# Patient Record
Sex: Female | Born: 1975 | Race: Black or African American | Hispanic: No | Marital: Married | Smoking: Never smoker
Health system: Southern US, Community
[De-identification: ages and names within clinical notes are randomized; demographics above are authoritative.]

## PROBLEM LIST (undated history)

## (undated) DIAGNOSIS — J302 Other seasonal allergic rhinitis: Secondary | ICD-10-CM

## (undated) DIAGNOSIS — R011 Cardiac murmur, unspecified: Secondary | ICD-10-CM

## (undated) DIAGNOSIS — L309 Dermatitis, unspecified: Secondary | ICD-10-CM

## (undated) DIAGNOSIS — I10 Essential (primary) hypertension: Secondary | ICD-10-CM

## (undated) HISTORY — PX: OTHER SURGICAL HISTORY: SHX169

## (undated) HISTORY — PX: ADENOIDECTOMY: SUR15

## (undated) HISTORY — DX: Other seasonal allergic rhinitis: J30.2

## (undated) HISTORY — DX: Dermatitis, unspecified: L30.9

## (undated) HISTORY — DX: Cardiac murmur, unspecified: R01.1

---
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
## 2012-05-26 LAB — CBC AND DIFFERENTIAL
HCT: 39 (ref 36–46)
HEMOGLOBIN: 13.4 (ref 12.0–16.0)
PLATELETS: 187 (ref 150–399)
WBC: 4.5

## 2012-05-26 LAB — HEPATIC FUNCTION PANEL
ALK PHOS: 50 (ref 25–125)
ALT: 15 (ref 7–35)
AST: 17 (ref 13–35)
Bilirubin, Total: 0.3

## 2012-05-26 LAB — BASIC METABOLIC PANEL
BUN: 10 (ref 4–21)
CREATININE: 0.8 (ref 0.5–1.1)
GLUCOSE: 79
POTASSIUM: 4.1 (ref 3.4–5.3)
Sodium: 140 (ref 137–147)

## 2012-05-26 LAB — LIPID PANEL
CHOLESTEROL: 187 (ref 0–200)
HDL: 54 (ref 35–70)
LDL Cholesterol: 115
Triglycerides: 89 (ref 40–160)

## 2012-05-26 LAB — TSH: TSH: 0.54 (ref 0.41–5.90)

## 2015-05-30 LAB — LIPID PANEL
Cholesterol: 196 (ref 0–200)
HDL: 66 (ref 35–70)
LDL Cholesterol: 120
Triglycerides: 51 (ref 40–160)

## 2015-05-30 LAB — HEPATIC FUNCTION PANEL
ALT: 15 (ref 7–35)
AST: 18 (ref 13–35)
Alkaline Phosphatase: 84 (ref 25–125)
BILIRUBIN, TOTAL: 0.5

## 2015-05-30 LAB — BASIC METABOLIC PANEL
BUN: 9 (ref 4–21)
CREATININE: 0.7 (ref 0.5–1.1)
GLUCOSE: 71
POTASSIUM: 3.8 (ref 3.4–5.3)
SODIUM: 142 (ref 137–147)

## 2015-05-30 LAB — CBC AND DIFFERENTIAL
HCT: 41 (ref 36–46)
HEMOGLOBIN: 13.5 (ref 12.0–16.0)
Platelets: 168 (ref 150–399)
WBC: 5.1

## 2015-05-30 LAB — TSH: TSH: 0.67 (ref 0.41–5.90)

## 2015-09-11 ENCOUNTER — Emergency Department (HOSPITAL_COMMUNITY)
Admission: EM | Admit: 2015-09-11 | Discharge: 2015-09-11 | Disposition: A | Discharge status: Home / Self Care | Payer: BC Managed Care – PPO | Attending: Emergency Medicine | Admitting: Emergency Medicine

## 2015-09-11 ENCOUNTER — Encounter (HOSPITAL_COMMUNITY): Payer: Self-pay | Admitting: Emergency Medicine

## 2015-09-11 DIAGNOSIS — R319 Hematuria, unspecified: Secondary | ICD-10-CM | POA: Insufficient documentation

## 2015-09-11 DIAGNOSIS — Z79899 Other long term (current) drug therapy: Secondary | ICD-10-CM | POA: Insufficient documentation

## 2015-09-11 DIAGNOSIS — Z3202 Encounter for pregnancy test, result negative: Secondary | ICD-10-CM | POA: Insufficient documentation

## 2015-09-11 DIAGNOSIS — R112 Nausea with vomiting, unspecified: Secondary | ICD-10-CM | POA: Insufficient documentation

## 2015-09-11 DIAGNOSIS — R1031 Right lower quadrant pain: Secondary | ICD-10-CM | POA: Diagnosis present

## 2015-09-11 DIAGNOSIS — R8299 Other abnormal findings in urine: Secondary | ICD-10-CM | POA: Diagnosis not present

## 2015-09-11 DIAGNOSIS — Z87442 Personal history of urinary calculi: Secondary | ICD-10-CM | POA: Diagnosis not present

## 2015-09-11 DIAGNOSIS — R109 Unspecified abdominal pain: Secondary | ICD-10-CM

## 2015-09-11 DIAGNOSIS — R82998 Other abnormal findings in urine: Secondary | ICD-10-CM

## 2015-09-11 LAB — URINALYSIS, ROUTINE W REFLEX MICROSCOPIC
Bilirubin Urine: NEGATIVE
GLUCOSE, UA: NEGATIVE mg/dL
Ketones, ur: NEGATIVE mg/dL
Nitrite: NEGATIVE
PROTEIN: 30 mg/dL — AB
Specific Gravity, Urine: 1.025 (ref 1.005–1.030)
pH: 5.5 (ref 5.0–8.0)

## 2015-09-11 LAB — COMPREHENSIVE METABOLIC PANEL
ALBUMIN: 4 g/dL (ref 3.5–5.0)
ALT: 14 U/L (ref 14–54)
AST: 13 U/L — AB (ref 15–41)
Alkaline Phosphatase: 87 U/L (ref 38–126)
Anion gap: 11 (ref 5–15)
BUN: 14 mg/dL (ref 6–20)
CHLORIDE: 107 mmol/L (ref 101–111)
CO2: 23 mmol/L (ref 22–32)
CREATININE: 0.86 mg/dL (ref 0.44–1.00)
Calcium: 9.6 mg/dL (ref 8.9–10.3)
GFR calc non Af Amer: >60 mL/min (ref >60)
Glucose, Bld: 102 mg/dL — ABNORMAL HIGH (ref 65–99)
Potassium: 3.5 mmol/L (ref 3.5–5.1)
SODIUM: 141 mmol/L (ref 135–145)
Total Bilirubin: 0.2 mg/dL — ABNORMAL LOW (ref 0.3–1.2)
Total Protein: 7.1 g/dL (ref 6.5–8.1)

## 2015-09-11 LAB — CBC
HCT: 39.1 % (ref 36.0–46.0)
Hemoglobin: 13.3 g/dL (ref 12.0–15.0)
MCH: 30.2 pg (ref 26.0–34.0)
MCHC: 34 g/dL (ref 30.0–36.0)
MCV: 88.7 fL (ref 78.0–100.0)
PLATELETS: 205 10*3/uL (ref 150–400)
RBC: 4.41 MIL/uL (ref 3.87–5.11)
RDW: 13.5 % (ref 11.5–15.5)
WBC: 6.3 10*3/uL (ref 4.0–10.5)

## 2015-09-11 LAB — POC URINE PREG, ED: Preg Test, Ur: NEGATIVE

## 2015-09-11 LAB — URINE MICROSCOPIC-ADD ON: BACTERIA UA: NONE SEEN

## 2015-09-11 LAB — LIPASE, BLOOD: LIPASE: 30 U/L (ref 11–51)

## 2015-09-11 MED ORDER — ONDANSETRON 4 MG PO TBDP
4.0000 mg | ORAL_TABLET | Freq: Once | ORAL | Status: AC
  Administered 2015-09-11: 4 mg via ORAL
  Filled 2015-09-11: qty 1

## 2015-09-11 MED ORDER — ONDANSETRON HCL 4 MG PO TABS: 4.0000 mg | ORAL_TABLET | Freq: Three times a day (TID) | ORAL | Status: AC | PRN

## 2015-09-11 MED ORDER — HYDROCODONE-ACETAMINOPHEN 5-325 MG PO TABS
1.0000 | ORAL_TABLET | Freq: Once | ORAL | Status: DC
  Filled 2015-09-11: qty 1

## 2015-09-11 MED ORDER — HYDROCODONE-ACETAMINOPHEN 5-325 MG PO TABS: 1.0000 | ORAL_TABLET | Freq: Four times a day (QID) | ORAL | Status: DC | PRN

## 2015-09-11 NOTE — ED Notes (Signed)
Pt. reports RLQ pain radiating to right lower bacj with emesis onset Saturday night , denies diarrhea / no fever or chills.

## 2015-09-11 NOTE — Discharge Instructions (Signed)
Abdominal Pain, Adult Many things can cause belly (abdominal) pain. Most times, the belly pain is not dangerous. Many cases of belly pain can be watched and treated at home. HOME CARE   Do not take medicines that help you go poop (laxatives) unless told to by your doctor.  Only take medicine as told by your doctor.  Eat or drink as told by your doctor. Your doctor will tell you if you should be on a special diet. GET HELP IF:  You do not know what is causing your belly pain.  You have belly pain while you are sick to your stomach (nauseous) or have runny poop (diarrhea).  You have pain while you pee or poop.  Your belly pain wakes you up at night.  You have belly pain that gets worse or better when you eat.  You have belly pain that gets worse when you eat fatty foods.  You have a fever. GET HELP RIGHT AWAY IF:   The pain does not go away within 2 hours.  You keep throwing up (vomiting).  The pain changes and is only in the right or left part of the belly.  You have bloody or tarry looking poop. MAKE SURE YOU:   Understand these instructions.  Will watch your condition.  Will get help right away if you are not doing well or get worse.   This information is not intended to replace advice given to you by your health care provider. Make sure you discuss any questions you have with your health care provider.   Document Released: 12/25/2007 Document Revised: 07/29/2014 Document Reviewed: 03/17/2013 Elsevier Interactive Patient Education 2016 [employer]. Renal Colic Renal colic is pain that is caused by passing a kidney stone. The pain can be sharp and severe. It may be felt in the back, abdomen, side (flank), or groin. It can cause nausea. Renal colic can come and go. HOME CARE INSTRUCTIONS Watch your condition for any changes. The following actions may help to lessen any discomfort that you are feeling:  Take medicines only as directed by your health care  provider.  Ask your health care provider if it is okay to take over-the-counter pain medicine.  Drink enough fluid to keep your urine clear or pale yellow. Drink 6-8 glasses of water each day.  Limit the amount of salt that you eat to less than 2 grams per day.  Reduce the amount of protein in your diet. Eat less meat, fish, nuts, and dairy.  Avoid foods such as spinach, rhubarb, nuts, or bran. These may make kidney stones more likely to form. SEEK MEDICAL CARE IF:  You have a fever or chills.  Your urine smells bad or looks cloudy.  You have pain or burning when you pass urine. SEEK IMMEDIATE MEDICAL CARE IF:  Your flank pain or groin pain suddenly worsens.  You become confused or disoriented or you lose consciousness.   This information is not intended to replace advice given to you by your health care provider. Make sure you discuss any questions you have with your health care provider.   Document Released: 04/17/2005 Document Revised: 07/29/2014 Document Reviewed: 05/18/2014 Elsevier Interactive Patient Education [employer].

## 2015-09-11 NOTE — ED Provider Notes (Signed)
CSN: [id number]     Arrival date & time 09/11/15  0529 History   First MD Initiated Contact with Patient 09/11/15 [phone]     Chief Complaint  Patient presents with  . Abdominal Pain     (Consider location/radiation/quality/duration/timing/severity/associated sxs/prior Treatment) Patient is a 40 y.o. female presenting with abdominal pain.  Abdominal Pain Pain location:  RLQ Pain quality: cramping   Pain radiates to:  R flank Pain severity:  Mild (at this time, but intermittently severe) Onset quality:  Gradual Duration:  2 days Timing:  Intermittent Progression:  Waxing and waning Chronicity:  New (Patient has a history of prior kidney stones. However her last episode was many years ago states that her last episode was more severe than this.) Context: not diet changes, not eating, not recent illness and not sick contacts   Relieved by: Self resolving. Worsened by:  Nothing tried Associated symptoms: nausea and vomiting (When the pain was extremely severe.)   Associated symptoms: no chest pain, no chills, no cough, no diarrhea, no dysuria, no fatigue, no fever, no shortness of breath, no sore throat, no vaginal bleeding and no vaginal discharge     History reviewed. No pertinent past medical history. History reviewed. No pertinent past surgical history. No family history on file. Social History  Substance Use Topics  . Smoking status: Never Smoker   . Smokeless tobacco: None  . Alcohol Use: No   OB History    No data available     Review of Systems  Constitutional: Negative for fever, chills, appetite change and fatigue.  HENT: Negative for congestion, ear pain, facial swelling, mouth sores and sore throat.   Eyes: Negative for visual disturbance.  Respiratory: Negative for cough, chest tightness and shortness of breath.   Cardiovascular: Negative for chest pain and palpitations.  Gastrointestinal: Positive for nausea, vomiting (When the pain was extremely severe.) and  abdominal pain. Negative for diarrhea and blood in stool.  Endocrine: Negative for cold intolerance and heat intolerance.  Genitourinary: Negative for dysuria, frequency, decreased urine volume, vaginal bleeding, vaginal discharge and difficulty urinating.  Musculoskeletal: Negative for back pain and neck stiffness.  Skin: Negative for rash.  Neurological: Negative for dizziness, weakness, light-headedness and headaches.  All other systems reviewed and are negative.     Allergies  Review of patient's allergies indicates no known allergies.  Home Medications   Prior to Admission medications   Medication Sig Start Date End Date Taking? Authorizing Provider  Prenatal Vit-Fe Fumarate-FA (MULTIVITAMIN-PRENATAL) 27-0.8 MG TABS tablet Take 1 tablet by mouth daily at 12 noon.   Yes Historical Provider, MD  HYDROcodone-acetaminophen (NORCO) 5-325 MG tablet Take 1 tablet by mouth every 6 (six) hours as needed for moderate pain. 09/11/15   Addison Lank, MD  ondansetron (ZOFRAN) 4 MG tablet Take 1 tablet (4 mg total) by mouth every 8 (eight) hours as needed for nausea or vomiting. 09/11/15 09/15/15  Addison Lank, MD   BP 147/87 mmHg  Pulse 56  Temp(Src) 98.2 F (36.8 C) (Oral)  Resp 24  Ht 5\' 3"  (1.6 m)  Wt 83.008 kg  BMI 32.43 kg/m2  SpO2 100%  LMP 09/05/2015 Physical Exam  Constitutional: She is oriented to person, place, and time. She appears well-developed and well-nourished. No distress.  HENT:  Head: Normocephalic and atraumatic.  Right Ear: External ear normal.  Left Ear: External ear normal.  Nose: Nose normal.  Eyes: Conjunctivae and EOM are normal. Pupils are equal, round, and reactive to light. Right  eye exhibits no discharge. Left eye exhibits no discharge. No scleral icterus.  Neck: Normal range of motion. Neck supple.  Cardiovascular: Normal rate, regular rhythm and normal heart sounds.  Exam reveals no gallop and no friction rub.   No murmur heard. Pulmonary/Chest: Effort  normal and breath sounds normal. No stridor. No respiratory distress. She has no wheezes.  Abdominal: Soft. She exhibits no distension. There is tenderness (mild discomfort) in the right lower quadrant. There is no rigidity, no guarding and no CVA tenderness. No hernia.  Musculoskeletal: She exhibits no edema or tenderness.  Neurological: She is alert and oriented to person, place, and time.  Skin: Skin is warm and dry. No rash noted. She is not diaphoretic. No erythema.  Psychiatric: She has a normal mood and affect.    ED Course  Procedures (including critical care time) Labs Review Labs Reviewed  COMPREHENSIVE METABOLIC PANEL - Abnormal; Notable for the following:    Glucose, Bld 102 (*)    AST 13 (*)    Total Bilirubin 0.2 (*)    All other components within normal limits  URINALYSIS, ROUTINE W REFLEX MICROSCOPIC (NOT AT [facility]) - Abnormal; Notable for the following:    APPearance CLOUDY (*)    Hgb urine dipstick LARGE (*)    Protein, ur 30 (*)    Leukocytes, UA TRACE (*)    All other components within normal limits  URINE MICROSCOPIC-ADD ON - Abnormal; Notable for the following:    Squamous Epithelial / LPF 0-5 (*)    Crystals CA OXALATE CRYSTALS (*)    All other components within normal limits  LIPASE, BLOOD  CBC  POC URINE PREG, ED    Imaging Review No results found. I have personally reviewed and evaluated these images and lab results as part of my medical decision-making.   EKG Interpretation None      MDM    39 year old female with a history of prior kidney stones presents to the ED with right lower quadrant abdominal pain radiating to her right flank that is currently almost completely resolved. Rest of the history as above.  On arrival patient is afebrile with stable vital signs. No acute distress, well-appearing, well-hydrated. Abdomen with mild right lower quadrant discomfort. Negative McBurney's. No evidence of peritonitis.   Labs without leukocytosis,  biliary obstruction, renal insufficiency. UPT negative. UA with large amount of blood as well as calcium oxalate crystals. Given her presentation in conjunction with prior history of stones we believe that this is consistent with kidney stones. Presentation concerning for obstruction. Low concern for SBO, appendicitis, ovarian torsion, PID.  Patient provided with symptomatic treatment. Patient is safe for discharge with strict return precautions. Patient to follow-up with PCP as needed.  She was seen in conjunction with Dr. Rex Kras.  Diagnostic studies interpreted by me and use to my clinical decision-making.   Final diagnoses:  Abdominal cramping  Calcium oxalate crystals in urine  Hematuria        Addison Lank, MD 09/11/15 [place], MD 09/20/15 [phone]

## 2015-09-18 ENCOUNTER — Ambulatory Visit (INDEPENDENT_AMBULATORY_CARE_PROVIDER_SITE_OTHER): Payer: BC Managed Care – PPO | Admitting: Primary Care

## 2015-09-18 ENCOUNTER — Encounter: Payer: Self-pay | Admitting: Primary Care

## 2015-09-18 VITALS — BP 116/80 | HR 67 | Temp 98.1°F | Ht 63.0 in | Wt 184.8 lb

## 2015-09-18 DIAGNOSIS — E669 Obesity, unspecified: Secondary | ICD-10-CM | POA: Diagnosis not present

## 2015-09-18 DIAGNOSIS — D229 Melanocytic nevi, unspecified: Secondary | ICD-10-CM

## 2015-09-18 DIAGNOSIS — N200 Calculus of kidney: Secondary | ICD-10-CM | POA: Diagnosis not present

## 2015-09-18 NOTE — Progress Notes (Signed)
Subjective:    Patient ID: Katelyn Wright, female    DOB: 1976-04-18, 40 y.o.   MRN: [id number]  HPI  Katelyn Wright is a 40 year old female who presents today to establish care and discuss the problems mentioned below. She has her old records with her, including labs. Will reivew records. Her last physical was in November 2016. She and her husband just moved to the area from [place].  1) Emergency Department Follow Up: Evaluated on 09/11/15 at [facility] with complaints of RLQ abdominal pain with radiation to right flank. Her pain had been present for 2 days prior to her visit and was intermittent and crampy in nature. She has a medical history of kidney stones with her last one being in 2008.  During her stay in the ED she underwent labs. UA with evidence of blood and oxylate crystals. CMP and CBC unremarkable.   Since her last visit she's passed a total of 3 stones with symptoms of cramping and soreness. She's not noticed any hematuria since Saturday. She was prescribed Zofran and Vicodin but did not get these filled as she is breast feeding and does not want to reduce her milk supply. Overall feeling improved. No pain since the last passing of her stone.  2) Finger Nail Discoloration: About 2 weeks ago she noticed a dark spot to the left thumb nail. She's also noticed dark lines to her toes and numerous new moles to her chest wall and extremities. She is postpartum since Nov 28, 2014. She has not noticed any changes in size and shape of her new moles. No prior history of melanoma. She does have 2 large moles to her left jaw line that she was in the process of having removed prior to her move to [place]. She was unable to have these removed now that she's stable in [place].  Review of Systems  Constitutional: Negative for unexpected weight change.  HENT: Negative for rhinorrhea.   Respiratory: Negative for cough and shortness of breath.   Cardiovascular: Negative for chest pain and palpitations.  Gastrointestinal:  Negative for diarrhea and constipation.       Bowel movements every other day  Genitourinary: Negative for dysuria, hematuria, flank pain and difficulty urinating.  Musculoskeletal: Negative for myalgias and arthralgias.  Skin:       See HPI  Psychiatric/Behavioral:       Denies concerns for anxiety or depression.        Past Medical History  Diagnosis Date  . Seasonal allergies   . Heart murmur     Social History   Social History  . Marital Status: Married    Spouse Name: N/A  . Number of Children: N/A  . Years of Education: N/A   Occupational History  . Not on file.   Social History Main Topics  . Smoking status: Never Smoker   . Smokeless tobacco: Not on file  . Alcohol Use: No  . Drug Use: No  . Sexual Activity: Not on file   Other Topics Concern  . Not on file   Social History Narrative   Married.   1 daughter.    Works as a Agricultural engineer.   Enjoys spending time with her family, working out.     Past Surgical History  Procedure Laterality Date  . Tonsillectomy and adenoidectomy  1994    Family History  Problem Relation Age of Onset  . Arthritis Mother   . Hypertension Mother   . Hypertension Father   .  Alcohol abuse Maternal Uncle   . Heart disease Maternal Grandfather   . Breast cancer Paternal Grandmother     No Known Allergies  Current Outpatient Prescriptions on File Prior to Visit  Medication Sig Dispense Refill  . Prenatal Vit-Fe Fumarate-FA (MULTIVITAMIN-PRENATAL) 27-0.8 MG TABS tablet Take 1 tablet by mouth daily at 12 noon.     No current facility-administered medications on file prior to visit.    BP 116/80 mmHg  Pulse 67  Temp(Src) 98.1 F (36.7 C) (Oral)  Ht 5\' 3"  (1.6 m)  Wt 184 lb 12.8 oz (83.825 kg)  BMI 32.74 kg/m2  SpO2 99%  LMP 09/05/2015    Objective:   Physical Exam  Constitutional: She is oriented to person, place, and time. She appears well-nourished.  Cardiovascular: Normal rate and regular rhythm.     Pulmonary/Chest: Effort normal and breath sounds normal.  Neurological: She is alert and oriented to person, place, and time.  Skin: Skin is warm and dry.  Numerous small, dark moles to anterior chest wall. Small discoloration to right breast at 9 o'clock position. 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   Psychiatric: She has a normal mood and affect.          Assessment & Plan:  Skin Changes:  Numerous new nevi to body. Also with new spot to left 1st digit nail bed. 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 Due to multiple new nevi and request for mole removal to left mandibular region, referral will be placed to dermatology for further evaluation.  No obvious melanoma concern noted, but will have derm evaluate.

## 2015-09-18 NOTE — Assessment & Plan Note (Signed)
Recurrent. Also in 2001, 2008, and present. She has passed 3 stones total since ED visit and is feeling well.  Exam unremarkable. Discussed prevention techniques for kidney stones.

## 2015-09-18 NOTE — Progress Notes (Signed)
Pre visit review using our clinic review tool, if applicable. No additional management support is needed unless otherwise documented below in the visit note. 

## 2015-09-18 NOTE — Assessment & Plan Note (Signed)
Spent a long time discussing ways to lose weight through healthy diet and routine exercise. Material provided to patient, recommended My Fitness Pal app for calorie counting.

## 2015-09-22 ENCOUNTER — Telehealth: Payer: Self-pay

## 2015-09-22 ENCOUNTER — Encounter (HOSPITAL_COMMUNITY): Payer: Self-pay | Admitting: *Deleted

## 2015-09-22 ENCOUNTER — Emergency Department (HOSPITAL_COMMUNITY)
Admission: EM | Admit: 2015-09-22 | Discharge: 2015-09-22 | Disposition: A | Discharge status: Home / Self Care | Payer: BC Managed Care – PPO | Source: Home / Self Care | Attending: Family Medicine | Admitting: Family Medicine

## 2015-09-22 ENCOUNTER — Emergency Department (HOSPITAL_COMMUNITY)
Admission: EM | Admit: 2015-09-22 | Discharge: 2015-09-22 | Discharge status: Absent without leave | Payer: BC Managed Care – PPO

## 2015-09-22 DIAGNOSIS — R1011 Right upper quadrant pain: Secondary | ICD-10-CM

## 2015-09-22 DIAGNOSIS — R109 Unspecified abdominal pain: Secondary | ICD-10-CM

## 2015-09-22 LAB — POCT URINALYSIS DIP (DEVICE)
BILIRUBIN URINE: NEGATIVE
GLUCOSE, UA: NEGATIVE mg/dL
Ketones, ur: 40 mg/dL — AB
LEUKOCYTES UA: NEGATIVE
NITRITE: NEGATIVE
PH: 6 (ref 5.0–8.0)
Protein, ur: NEGATIVE mg/dL
Specific Gravity, Urine: >=1.03 (ref 1.005–1.030)
Urobilinogen, UA: 0.2 mg/dL (ref 0.0–1.0)

## 2015-09-22 MED ORDER — HYDROCODONE-ACETAMINOPHEN 5-325 MG PO TABS: 1.0000 | ORAL_TABLET | ORAL | Status: DC | PRN

## 2015-09-22 MED ORDER — ONDANSETRON HCL 4 MG PO TABS: 4.0000 mg | ORAL_TABLET | Freq: Four times a day (QID) | ORAL | Status: DC

## 2015-09-22 MED ORDER — TAMSULOSIN HCL 0.4 MG PO CAPS: 0.4000 mg | ORAL_CAPSULE | Freq: Every day | ORAL | Status: DC

## 2015-09-22 NOTE — ED Notes (Signed)
Pt   Was  Seen   approx  13 days   And   Was  Diagnosed   With  Kidney     Stone        Was     Discharged  From     hosp       And       Did  Not  Get         Her  rx    Filled           The  Pain     Coming  And  Going  Since  Then     -  Pt  Was   Seen     By   A    pcp  [place]       3  Days  Ago   Feels  Dizzy      Was     Dropped  Off   By  Her    Husband    Pain  Scale  Is  5   Now      With  Some  nausea

## 2015-09-22 NOTE — ED Provider Notes (Signed)
CSN: [id number]     Arrival date & time 09/22/15  1754 History   None    Chief Complaint  Patient presents with  . Flank Pain   (Consider location/radiation/quality/duration/timing/severity/associated sxs/prior Treatment) Patient is a 40 y.o. female presenting with flank pain. The history is provided by the patient.  Flank Pain This is a recurrent problem. The current episode started more than 2 days ago. The problem has been gradually worsening. Associated symptoms include abdominal pain. Pertinent negatives include no chest pain, no headaches and no shortness of breath.    Past Medical History  Diagnosis Date  . Seasonal allergies   . Heart murmur    Past Surgical History  Procedure Laterality Date  . Tonsillectomy and adenoidectomy  1994   Family History  Problem Relation Age of Onset  . Arthritis Mother   . Hypertension Mother   . Hypertension Father   . Alcohol abuse Maternal Uncle   . Heart disease Maternal Grandfather   . Breast cancer Paternal Grandmother    Social History  Substance Use Topics  . Smoking status: Never Smoker   . Smokeless tobacco: None  . Alcohol Use: No   OB History    No data available     Review of Systems  Constitutional: Negative.   Respiratory: Negative for shortness of breath.   Cardiovascular: Negative for chest pain.  Gastrointestinal: Positive for nausea, vomiting and abdominal pain. Negative for diarrhea and constipation.  Genitourinary: Positive for flank pain. Negative for dysuria, urgency, hematuria, difficulty urinating, menstrual problem and pelvic pain.  Musculoskeletal: Negative.   Skin: Negative.   Neurological: Negative for headaches.  All other systems reviewed and are negative.   Allergies  Review of patient's allergies indicates no known allergies.  Home Medications   Prior to Admission medications   Medication Sig Start Date End Date Taking? Authorizing Provider  HYDROcodone-acetaminophen (NORCO/VICODIN)  5-325 MG tablet Take 1 tablet by mouth every 4 (four) hours as needed. 09/22/15   Billy Fischer, MD  ondansetron (ZOFRAN) 4 MG tablet Take 1 tablet (4 mg total) by mouth every 6 (six) hours. Prn n/v. 09/22/15   Billy Fischer, MD  Prenatal Vit-Fe Fumarate-FA (MULTIVITAMIN-PRENATAL) 27-0.8 MG TABS tablet Take 1 tablet by mouth daily at 12 noon.    Historical Provider, MD  tamsulosin (FLOMAX) 0.4 MG CAPS capsule Take 1 capsule (0.4 mg total) by mouth daily. 09/22/15   Billy Fischer, MD   Meds Ordered and Administered this Visit  Medications - No data to display  BP 120/78 mmHg  Pulse 64  Temp(Src) 98.7 F (37.1 C) (Oral)  Resp 12  SpO2 100%  LMP 09/05/2015 No data found.   Physical Exam  Constitutional: She is oriented to person, place, and time. She appears well-developed and well-nourished. She appears distressed.  HENT:  Head: Normocephalic.  Mouth/Throat: Oropharynx is clear and moist.  Pulmonary/Chest: Breath sounds normal.  Abdominal: Soft. Bowel sounds are normal. She exhibits no distension and no mass. There is tenderness. There is no rebound and no guarding.  Musculoskeletal: She exhibits no tenderness.  Lymphadenopathy:    She has no cervical adenopathy.  Neurological: She is alert and oriented to person, place, and time. No cranial nerve deficit.  Skin: Skin is warm and dry.  Nursing note and vitals reviewed.   ED Course  Procedures (including critical care time)  Labs Review Labs Reviewed  POCT URINALYSIS DIP (DEVICE) - Abnormal; Notable for the following:    Ketones, ur 40 (*)  Hgb urine dipstick TRACE (*)    All other components within normal limits    Imaging Review No results found.   Visual Acuity Review  Right Eye Distance:   Left Eye Distance:   Bilateral Distance:    Right Eye Near:   Left Eye Near:    Bilateral Near:         MDM   1. Acute right flank pain        Billy Fischer, MD 09/22/15 2037

## 2015-09-22 NOTE — Discharge Instructions (Signed)
See urologist next week for recheck. Sooner if problem worsens.

## 2015-09-22 NOTE — Telephone Encounter (Signed)
Pt left v/m pt did not get rx for vicodan and zofran. Kidney stones is bothering her again and [facility] [place] needs new rx for these meds. Sp[facility] with [facility] M[facility] at [facility] and if rx is from ED will not fill rx after one week.Pain in a lot of pain on rt side and back. Pain level is 10. Pt went to [facility] ED and was too busy and pt left. Spoke with Dr Diona Browner and advised pt if did not want to go to ED to go to [facility]; Dr Diona Browner cannot call in pain med and office is closed so cannot pick up rx. Pt voiced understanding and will go to [facility]>

## 2015-09-27 ENCOUNTER — Encounter (HOSPITAL_COMMUNITY): Payer: Self-pay | Admitting: *Deleted

## 2015-09-27 ENCOUNTER — Other Ambulatory Visit: Payer: Self-pay | Admitting: Urology

## 2015-09-29 ENCOUNTER — Ambulatory Visit (HOSPITAL_COMMUNITY): Payer: BC Managed Care – PPO | Admitting: Anesthesiology

## 2015-09-29 ENCOUNTER — Encounter (HOSPITAL_COMMUNITY)
Admission: RE | Disposition: A | Discharge status: Home / Self Care | Payer: Self-pay | Source: Ambulatory Visit | Attending: Urology

## 2015-09-29 ENCOUNTER — Ambulatory Visit (HOSPITAL_COMMUNITY): Payer: BC Managed Care – PPO

## 2015-09-29 ENCOUNTER — Encounter (HOSPITAL_COMMUNITY): Payer: Self-pay | Admitting: *Deleted

## 2015-09-29 ENCOUNTER — Ambulatory Visit (HOSPITAL_COMMUNITY)
Admission: RE | Admit: 2015-09-29 | Discharge: 2015-09-29 | Disposition: A | Discharge status: Home / Self Care | Payer: BC Managed Care – PPO | Source: Ambulatory Visit | Attending: Urology | Admitting: Urology

## 2015-09-29 DIAGNOSIS — Z87442 Personal history of urinary calculi: Secondary | ICD-10-CM | POA: Diagnosis not present

## 2015-09-29 DIAGNOSIS — N201 Calculus of ureter: Secondary | ICD-10-CM | POA: Diagnosis present

## 2015-09-29 DIAGNOSIS — Z6833 Body mass index (BMI) 33.0-33.9, adult: Secondary | ICD-10-CM | POA: Insufficient documentation

## 2015-09-29 DIAGNOSIS — E669 Obesity, unspecified: Secondary | ICD-10-CM | POA: Insufficient documentation

## 2015-09-29 DIAGNOSIS — Z79899 Other long term (current) drug therapy: Secondary | ICD-10-CM | POA: Diagnosis not present

## 2015-09-29 HISTORY — PX: CYSTOSCOPY WITH URETEROSCOPY AND STENT PLACEMENT: SHX6377

## 2015-09-29 HISTORY — PX: HOLMIUM LASER APPLICATION: SHX5852

## 2015-09-29 LAB — PREGNANCY, URINE: Preg Test, Ur: NEGATIVE

## 2015-09-29 SURGERY — CYSTOURETEROSCOPY, WITH STENT INSERTION
Anesthesia: General | Laterality: Right

## 2015-09-29 MED ORDER — CEFAZOLIN SODIUM-DEXTROSE 2-3 GM-% IV SOLR: 2.0000 g | INTRAVENOUS | Status: DC

## 2015-09-29 MED ORDER — PHENAZOPYRIDINE HCL 200 MG PO TABS
ORAL_TABLET | ORAL | Status: AC
  Filled 2015-09-29: qty 1

## 2015-09-29 MED ORDER — IBUPROFEN 600 MG PO TABS
600.0000 mg | ORAL_TABLET | Freq: Once | ORAL | Status: AC
  Administered 2015-09-29: 600 mg via ORAL
  Filled 2015-09-29: qty 1

## 2015-09-29 MED ORDER — PROMETHAZINE HCL 25 MG/ML IJ SOLN: 6.2500 mg | INTRAMUSCULAR | Status: DC | PRN

## 2015-09-29 MED ORDER — OXYBUTYNIN CHLORIDE 5 MG PO TABS
5.0000 mg | ORAL_TABLET | Freq: Once | ORAL | Status: AC
  Administered 2015-09-29: 5 mg via ORAL

## 2015-09-29 MED ORDER — MIDAZOLAM HCL 2 MG/2ML IJ SOLN
INTRAMUSCULAR | Status: AC
Start: 2015-09-29 — End: 2015-09-29
  Filled 2015-09-29: qty 2

## 2015-09-29 MED ORDER — SODIUM CHLORIDE 0.9 % IR SOLN
Status: DC | PRN
  Administered 2015-09-29: 4000 mL via INTRAVESICAL

## 2015-09-29 MED ORDER — ONDANSETRON HCL 4 MG/2ML IJ SOLN
INTRAMUSCULAR | Status: DC | PRN
  Administered 2015-09-29: 4 mg via INTRAVENOUS

## 2015-09-29 MED ORDER — FENTANYL CITRATE (PF) 100 MCG/2ML IJ SOLN
INTRAMUSCULAR | Status: DC | PRN
  Administered 2015-09-29 (×2): 50 ug via INTRAVENOUS

## 2015-09-29 MED ORDER — OXYBUTYNIN CHLORIDE 5 MG PO TABS
ORAL_TABLET | ORAL | Status: AC
  Filled 2015-09-29: qty 1

## 2015-09-29 MED ORDER — ONDANSETRON HCL 4 MG/2ML IJ SOLN
INTRAMUSCULAR | Status: AC
  Filled 2015-09-29: qty 2

## 2015-09-29 MED ORDER — PHENAZOPYRIDINE HCL 200 MG PO TABS
200.0000 mg | ORAL_TABLET | Freq: Once | ORAL | Status: AC
  Administered 2015-09-29: 200 mg via ORAL

## 2015-09-29 MED ORDER — LIDOCAINE HCL (CARDIAC) 20 MG/ML IV SOLN
INTRAVENOUS | Status: AC
  Filled 2015-09-29: qty 5

## 2015-09-29 MED ORDER — FENTANYL CITRATE (PF) 100 MCG/2ML IJ SOLN
INTRAMUSCULAR | Status: AC
  Filled 2015-09-29: qty 2

## 2015-09-29 MED ORDER — HYDROMORPHONE HCL 1 MG/ML IJ SOLN
INTRAMUSCULAR | Status: AC
  Filled 2015-09-29: qty 1

## 2015-09-29 MED ORDER — DEXAMETHASONE SODIUM PHOSPHATE 4 MG/ML IJ SOLN
INTRAMUSCULAR | Status: DC | PRN
  Administered 2015-09-29: 10 mg via INTRAVENOUS

## 2015-09-29 MED ORDER — PROPOFOL 10 MG/ML IV BOLUS
INTRAVENOUS | Status: AC
  Filled 2015-09-29: qty 20

## 2015-09-29 MED ORDER — LIDOCAINE HCL (CARDIAC) 20 MG/ML IV SOLN
INTRAVENOUS | Status: DC | PRN
  Administered 2015-09-29: 80 mg via INTRAVENOUS

## 2015-09-29 MED ORDER — HYDROMORPHONE HCL 1 MG/ML IJ SOLN
0.2500 mg | INTRAMUSCULAR | Status: DC | PRN
  Administered 2015-09-29 (×2): 0.25 mg via INTRAVENOUS

## 2015-09-29 MED ORDER — IOHEXOL 300 MG/ML  SOLN
INTRAMUSCULAR | Status: DC | PRN
  Administered 2015-09-29: 10 mL via URETHRAL

## 2015-09-29 MED ORDER — PROPOFOL 10 MG/ML IV BOLUS
INTRAVENOUS | Status: DC | PRN
  Administered 2015-09-29 (×3): 10 mg via INTRAVENOUS
  Administered 2015-09-29: 170 mg via INTRAVENOUS

## 2015-09-29 MED ORDER — MEPERIDINE HCL 50 MG/ML IJ SOLN: 6.2500 mg | INTRAMUSCULAR | Status: DC | PRN

## 2015-09-29 MED ORDER — LACTATED RINGERS IV SOLN
INTRAVENOUS | Status: DC
  Administered 2015-09-29: 1000 mL via INTRAVENOUS

## 2015-09-29 MED ORDER — DEXAMETHASONE SODIUM PHOSPHATE 10 MG/ML IJ SOLN
INTRAMUSCULAR | Status: AC
Start: 2015-09-29 — End: 2015-09-29
  Filled 2015-09-29: qty 1

## 2015-09-29 MED ORDER — MIDAZOLAM HCL 5 MG/5ML IJ SOLN
INTRAMUSCULAR | Status: DC | PRN
  Administered 2015-09-29: 2 mg via INTRAVENOUS

## 2015-09-29 SURGICAL SUPPLY — 21 items
BAG URO CATCHER STRL LF (MISCELLANEOUS) ×2 IMPLANT
BASKET DAKOTA 1.9FR 11X120 (BASKET) ×2 IMPLANT
CATH INTERMIT  6FR 70CM (CATHETERS) ×2 IMPLANT
CATH URET 5FR 28IN CONE TIP (BALLOONS)
CATH URET 5FR 70CM CONE TIP (BALLOONS) IMPLANT
CLOTH BEACON ORANGE TIMEOUT ST (SAFETY) ×2 IMPLANT
FIBER LASER FLEXIVA 1000 (UROLOGICAL SUPPLIES) IMPLANT
FIBER LASER FLEXIVA 200 (UROLOGICAL SUPPLIES) ×2 IMPLANT
FIBER LASER FLEXIVA 365 (UROLOGICAL SUPPLIES) IMPLANT
FIBER LASER FLEXIVA 550 (UROLOGICAL SUPPLIES) IMPLANT
FIBER LASER TRAC TIP (UROLOGICAL SUPPLIES) IMPLANT
GLOVE BIOGEL M 8.0 STRL (GLOVE) ×6 IMPLANT
GOWN STRL REUS W/ TWL XL LVL3 (GOWN DISPOSABLE) ×2 IMPLANT
GOWN STRL REUS W/TWL XL LVL3 (GOWN DISPOSABLE) ×2 IMPLANT
GUIDEWIRE STR DUAL SENSOR (WIRE) ×2 IMPLANT
MANIFOLD NEPTUNE II (INSTRUMENTS) ×2 IMPLANT
PACK CYSTO (CUSTOM PROCEDURE TRAY) ×2 IMPLANT
SHEATH ACCESS URETERAL 24CM (SHEATH) ×2 IMPLANT
STENT POLARIS 5FRX24 (STENTS) ×2 IMPLANT
SYRINGE IRR TOOMEY STRL 70CC (SYRINGE) ×2 IMPLANT
TUBING CONNECTING 10 (TUBING) ×2 IMPLANT

## 2015-09-29 NOTE — Anesthesia Postprocedure Evaluation (Signed)
Anesthesia Post Note  Patient: Katelyn Wright  Procedure(s) Performed: Procedure(s) (LRB): RIGHT  URETEROSCOPY AND STENT PLACEMENT, RETROGRADE (Right) WITH HOLMIUM LASER (Right)  Patient location during evaluation: PACU Anesthesia Type: General Level of consciousness: sedated and patient cooperative Pain management: pain level controlled Vital Signs Assessment: post-procedure vital signs reviewed and stable Respiratory status: spontaneous breathing Cardiovascular status: stable Anesthetic complications: no    Last Vitals:  Filed Vitals:   09/29/15 1330 09/29/15 1356  BP: 124/87 125/76  Pulse: 58 68  Temp: 36.4 C 36.8 C  Resp: 14 14    Last Pain:  Filed Vitals:   09/29/15 1357  PainSc: [place]

## 2015-09-29 NOTE — Transfer of Care (Signed)
Immediate Anesthesia Transfer of Care Note  Patient: Katelyn Wright  Procedure(s) Performed: Procedure(s): RIGHT  URETEROSCOPY AND STENT PLACEMENT, RETROGRADE (Right) WITH HOLMIUM LASER (Right)  Patient Location: PACU  Anesthesia Type:General  Level of Consciousness: awake, alert , oriented and patient cooperative  Airway & Oxygen Therapy: Patient Spontanous Breathing and Patient connected to face mask oxygen  Post-op Assessment: Report given to RN and Post -op Vital signs reviewed and stable  Post vital signs: Reviewed and stable  Last Vitals:  Filed Vitals:   09/29/15 0852  BP: 113/81  Pulse: 64  Temp: 36.8 C  Resp: 16    Complications: No apparent anesthesia complications

## 2015-09-29 NOTE — Progress Notes (Signed)
Patient ambulated to BR w asst. Very unsteady on feet. States feels "loopy". Able urinate large amount urine

## 2015-09-29 NOTE — Anesthesia Preprocedure Evaluation (Addendum)
Anesthesia Evaluation  Patient identified by MRN, date of birth, ID band Patient awake    Reviewed: Allergy & Precautions, NPO status , Patient's Chart, lab work & pertinent test results  Airway Mallampati: II  TM Distance: >3 FB Neck ROM: Full    Dental no notable dental hx.    Pulmonary neg pulmonary ROS,    Pulmonary exam normal breath sounds clear to auscultation       Cardiovascular negative cardio ROS Normal cardiovascular exam+ Valvular Problems/Murmurs  Rhythm:Regular Rate:Normal     Neuro/Psych negative neurological ROS  negative psych ROS   GI/Hepatic negative GI ROS, Neg liver ROS,   Endo/Other  negative endocrine ROS  Renal/GU Renal disease     Musculoskeletal negative musculoskeletal ROS (+)   Abdominal (+) + obese,   Peds  Hematology negative hematology ROS (+)   Anesthesia Other Findings   Reproductive/Obstetrics negative OB ROS                             Anesthesia Physical Anesthesia Plan  ASA: II  Anesthesia Plan: General   Post-op Pain Management:    Induction: Intravenous  Airway Management Planned: LMA  Additional Equipment:   Intra-op Plan:   Post-operative Plan: Extubation in OR  Informed Consent: I have reviewed the patients History and Physical, chart, labs and discussed the procedure including the risks, benefits and alternatives for the proposed anesthesia with the patient or authorized representative who has indicated his/her understanding and acceptance.   Dental advisory given  Plan Discussed with: CRNA  Anesthesia Plan Comments:         Anesthesia Quick Evaluation

## 2015-09-29 NOTE — Discharge Instructions (Signed)

## 2015-09-29 NOTE — H&P (Signed)
History of Present Illness Katelyn Wright is a 40 year old female with a Rt. ureteral stone.    She was seen on 09/22/15 and at that time she indicated that she had been experiencing right flank pain that had become progressively worse. It radiated into the abdomen. Her urine was trace positive for blood by dipstick. She has a history of kidney stones. She said she passed one stone about 10-15 years ago. She has never required surgical instrumentation for her stones. She has had intermittent pain primarily in the right lower quadrant. When it occurs it is not relieved by positional change. It has been severe at times. She is not having pain currently. She was prescribed pain medication and Flomax but cannot take the Flomax because she is currently breast-feeding.   Past Medical History Problems  1. History of cardiac murmur (Z86.79)  Surgical History Problems  1. History of Tonsillectomy  Current Meds 1. BL Ibuprofen TABS;  Therapy: (Recorded:07Mar2017) to Recorded 2. Norco 5-325 MG Oral Tablet;  Therapy: (Recorded:07Mar2017) to Recorded 3. Prenatal TABS;  Therapy: (Recorded:07Mar2017) to Recorded 4. Tylenol CAPS;  Therapy: (Recorded:07Mar2017) to Recorded  Allergies Medication  1. No Known Drug Allergies  Family History Problems  1. Family history of ovarian cancer (Z80.41) : Mother  Social History Problems    Denied: History of Alcohol use   Caffeine use (F15.90)   Married   Never a smoker  Review of Systems Genitourinary, constitutional, skin, eye, otolaryngeal, hematologic/lymphatic, cardiovascular, pulmonary, endocrine, musculoskeletal, gastrointestinal, neurological and psychiatric system(s) were reviewed and pertinent findings if present are noted and are otherwise negative.  Genitourinary: urinary frequency and dysuria.  Gastrointestinal: nausea, vomiting, flank pain and diarrhea.  Musculoskeletal: back pain.  Neurological: dizziness.    Vitals Vital Signs   Height: 5 ft 3 in Weight: 183 lb  BMI Calculated: 32.42 BSA Calculated: 1.86 Blood Pressure: 114 / 70 Heart Rate: 61  Physical Exam Constitutional: Well nourished and well developed . No acute distress.  ENT:. The ears and nose are normal in appearance.  Neck: The appearance of the neck is normal and no neck mass is present.  Pulmonary: No respiratory distress and normal respiratory rhythm and effort.  Cardiovascular: Heart rate and rhythm are normal . No peripheral edema.  Abdomen: The abdomen is soft and nontender. No masses are palpated. No CVA tenderness. No hernias are palpable. No hepatosplenomegaly noted.  Lymphatics: The femoral and inguinal nodes are not enlarged or tender.  Skin: Normal skin turgor, no visible rash and no visible skin lesions.  Neuro/Psych:. Mood and affect are appropriate.    Results/Data Urine  COLOR STRAW  APPEARANCE CLEAR  SPECIFIC GRAVITY <1.005  pH 5.5  GLUCOSE NEGATIVE  BILIRUBIN NEGATIVE  KETONE NEGATIVE  BLOOD NEGATIVE  PROTEIN NEGATIVE  NITRITE NEGATIVE  LEUKOCYTE ESTERASE NEGATIVE   Old records or history reviewed: Notes from Dr. Shaune Pascal as above.  The following images/tracing/specimen were independently visualized:  CT scan: No left renal calculi are noted. A single 1-2 millimeters stone is located in the mid to upper pole of the right kidney. She has a 3 mm stone just above the bladder on the right side.  The following clinical lab reports were reviewed:  UA: Clear.    Assessment   I went over the results of her CT scan with her and clinically I feel that the calcification in her pelvis that is 3 mm in size is very possibly a stone although the radiologist did not seem to think that was the  case. Clinically though it seems that that would be the case.     We discussed the management of urinary stones. These options include observation, ureteroscopy, shockwave lithotripsy, and PCNL. We discussed which options are relevant to these  particular stones. We discussed the natural history of stones as well as the complications of untreated stones and the impact on quality of life without treatment as well as with each of the above listed treatments. We also discussed the efficacy of each treatment in its ability to clear the stone burden. With any of these management options I discussed the signs and symptoms of infection and the need for emergent treatment should these be experienced. For each option we discussed the ability of each procedure to clear the patient of their stone burden.    For observation I described the risks which include but are not limited to silent renal damage, life-threatening infection, need for emergent surgery, failure to pass stone, and pain.    For ureteroscopy I described the risks which include heart attack, stroke, pulmonary embolus, death, bleeding, infection, damage to contiguous structures, positioning injury, ureteral stricture, ureteral avulsion, ureteral injury, need for ureteral stent, inability to perform ureteroscopy, need for an interval procedure, inability to clear stone burden, stent discomfort and pain.    For shockwave lithotripsy I described the risks which include arrhythmia, kidney contusion, kidney hemorrhage, need for transfusion, long-term risk of diabetes or hypertension, back discomfort, flank ecchymosis, flank abrasion, inability to break up stone, inability to pass stone fragments, Steinstrasse, infection associated with obstructing stones, need for different surgical procedure and possible need for repeat shockwave lithotripsy.    After discussing all the options she has elected to proceed with ureteroscopy.   Plan  1. Prescription for Dilaudid.  2. She will be scheduled for ureteroscopic extraction of her right distal ureteral stone.

## 2015-09-29 NOTE — Anesthesia Procedure Notes (Signed)
Procedure Name: LMA Insertion Date/Time: 09/29/2015 11:55 AM Performed by: Raenette Rover Pre-anesthesia Checklist: Patient identified, Emergency Drugs available, Suction available and Patient being monitored Patient Re-evaluated:Patient Re-evaluated prior to inductionOxygen Delivery Method: Circle system utilized Preoxygenation: Pre-oxygenation with 100% oxygen Intubation Type: IV induction Ventilation: Mask ventilation without difficulty LMA: LMA inserted LMA Size: 4.0 Number of attempts: 1 Placement Confirmation: positive ETCO2,  CO2 detector and breath sounds checked- equal and bilateral Tube secured with: Tape Dental Injury: Teeth and Oropharynx as per pre-operative assessment

## 2015-09-29 NOTE — Progress Notes (Signed)
Patient a little more steady on feet. Asst to BR to void. Vomited small amout clear emesis while up

## 2015-09-29 NOTE — Op Note (Signed)
PATIENT:  Katelyn Wright  PRE-OPERATIVE DIAGNOSIS:  right Ureteral calculus  POST-OPERATIVE DIAGNOSIS: Same  PROCEDURE:  1. Cystoscopy with right retrograde pyelogram including interpretation. 2. Right ureteroscopy and laser lithotripsy 3. Right ureteral stone extraction 4. Right double-J stent placement  SURGEON: Claybon Jabs, MD  INDICATION: Mrs. Nicolls is a 40 year old female who has been experiencing intermittent right lower quadrant pain. A CT scan revealed a calcification along the expected course of the ureter and she has continued to have intermittent discomfort. We therefore discussed retrograde to confirm the stone and ureteroscopic management.  ANESTHESIA:  General  EBL:  Minimal  DRAINS: 5 French, 24 cm Polaris stent (with string)  SPECIMEN:  Stone taken to my office for composition analysis.  DESCRIPTION OF PROCEDURE: The patient was taken to the major OR and placed on the table. General anesthesia was administered and then the patient was moved to the dorsal lithotomy position. The genitalia was sterilely prepped and draped. An official timeout was performed.  Initially the 88 French cystoscope with 30 lens was passed under direct vision into the bladder. The bladder was then fully inspected. It was noted be free of any tumors, stones or inflammatory lesions. Ureteral orifices were of normal configuration and position. A 6 French open-ended ureteral catheter was then passed through the cystoscope into the ureteral orifice in order to perform a right retrograde pyelogram.  A retrograde pyelogram was performed by injecting full-strength contrast up the right ureter under direct fluoroscopic control. It revealed a filling defect in the distal ureter consistent with the stone seen on the preoperative KUB. The remainder of the ureter was noted to be normal as was the intrarenal collecting system. I then passed a 0.038 inch floppy-tipped guidewire through the open ended catheter and  into the area of the renal pelvis and this was left in place. The inner portion of a ureteral access sheath was then passed over the guidewire to gently dilate the intramural ureter. I then proceeded with ureteroscopy.  A 6 French microfine rigid ureteroscope was then passed under direct into the bladder and into the right orifice and up the ureter. The stone was identified and I felt it was too large to extract and therefore elected to proceed with laser lithotripsy. The 200  holmium laser fiber was used to fragment the stone. I then used the nitinol basket to extract all of the stone fragments and reinspection of the ureter ureteroscopically revealed no further stone fragments and no injury to the ureter. I then backloaded the cystoscope over the guidewire and passed the stent over the guidewire into the area of the renal pelvis. As the guidewire was removed good curl was noted in the renal pelvis. The bladder was drained and the cystoscope was then removed. The patient tolerated the procedure well no intraoperative complications.  PLAN OF CARE: Discharge to home after PACU  PATIENT DISPOSITION:  PACU - hemodynamically stable.

## 2015-09-30 ENCOUNTER — Emergency Department (HOSPITAL_COMMUNITY): Payer: BC Managed Care – PPO

## 2015-09-30 ENCOUNTER — Encounter (HOSPITAL_COMMUNITY): Payer: Self-pay | Admitting: Emergency Medicine

## 2015-09-30 ENCOUNTER — Emergency Department (HOSPITAL_COMMUNITY)
Admission: EM | Admit: 2015-09-30 | Discharge: 2015-09-30 | Disposition: A | Discharge status: Home / Self Care | Payer: BC Managed Care – PPO | Attending: Emergency Medicine | Admitting: Emergency Medicine

## 2015-09-30 DIAGNOSIS — R011 Cardiac murmur, unspecified: Secondary | ICD-10-CM | POA: Diagnosis not present

## 2015-09-30 DIAGNOSIS — K5900 Constipation, unspecified: Secondary | ICD-10-CM | POA: Insufficient documentation

## 2015-09-30 DIAGNOSIS — Y658 Other specified misadventures during surgical and medical care: Secondary | ICD-10-CM | POA: Diagnosis not present

## 2015-09-30 DIAGNOSIS — N390 Urinary tract infection, site not specified: Secondary | ICD-10-CM | POA: Diagnosis not present

## 2015-09-30 DIAGNOSIS — Z9889 Other specified postprocedural states: Secondary | ICD-10-CM | POA: Insufficient documentation

## 2015-09-30 DIAGNOSIS — R1031 Right lower quadrant pain: Secondary | ICD-10-CM | POA: Diagnosis present

## 2015-09-30 DIAGNOSIS — R319 Hematuria, unspecified: Secondary | ICD-10-CM

## 2015-09-30 DIAGNOSIS — Z79899 Other long term (current) drug therapy: Secondary | ICD-10-CM | POA: Diagnosis not present

## 2015-09-30 DIAGNOSIS — T8384XA Pain from genitourinary prosthetic devices, implants and grafts, initial encounter: Secondary | ICD-10-CM | POA: Diagnosis not present

## 2015-09-30 DIAGNOSIS — Z792 Long term (current) use of antibiotics: Secondary | ICD-10-CM | POA: Insufficient documentation

## 2015-09-30 DIAGNOSIS — Z3202 Encounter for pregnancy test, result negative: Secondary | ICD-10-CM | POA: Insufficient documentation

## 2015-09-30 LAB — POC URINE PREG, ED
Preg Test, Ur: NEGATIVE
Preg Test, Ur: NEGATIVE

## 2015-09-30 LAB — CBC WITH DIFFERENTIAL/PLATELET
Basophils Absolute: 0 10*3/uL (ref 0.0–0.1)
Basophils Relative: 0 %
EOS ABS: 0 10*3/uL (ref 0.0–0.7)
Eosinophils Relative: 0 %
HCT: 42.7 % (ref 36.0–46.0)
HEMOGLOBIN: 14.1 g/dL (ref 12.0–15.0)
LYMPHS ABS: 1.5 10*3/uL (ref 0.7–4.0)
LYMPHS PCT: 20 %
MCH: 29.3 pg (ref 26.0–34.0)
MCHC: 33 g/dL (ref 30.0–36.0)
MCV: 88.8 fL (ref 78.0–100.0)
MONOS PCT: 7 %
Monocytes Absolute: 0.5 10*3/uL (ref 0.1–1.0)
NEUTROS PCT: 73 %
Neutro Abs: 5.5 10*3/uL (ref 1.7–7.7)
Platelets: 244 10*3/uL (ref 150–400)
RBC: 4.81 MIL/uL (ref 3.87–5.11)
RDW: 13.4 % (ref 11.5–15.5)
WBC: 7.6 10*3/uL (ref 4.0–10.5)

## 2015-09-30 LAB — URINALYSIS, ROUTINE W REFLEX MICROSCOPIC
GLUCOSE, UA: NEGATIVE mg/dL
KETONES UR: 40 mg/dL — AB
NITRITE: POSITIVE — AB
PH: 6.5 (ref 5.0–8.0)
Protein, ur: 100 mg/dL — AB
Specific Gravity, Urine: 1.019 (ref 1.005–1.030)

## 2015-09-30 LAB — URINE MICROSCOPIC-ADD ON: SQUAMOUS EPITHELIAL / LPF: NONE SEEN

## 2015-09-30 LAB — I-STAT CHEM 8, ED
BUN: 14 mg/dL (ref 6–20)
CHLORIDE: 103 mmol/L (ref 101–111)
CREATININE: 0.7 mg/dL (ref 0.44–1.00)
Calcium, Ion: 1.17 mmol/L (ref 1.12–1.23)
Glucose, Bld: 108 mg/dL — ABNORMAL HIGH (ref 65–99)
HEMATOCRIT: 47 % — AB (ref 36.0–46.0)
Hemoglobin: 16 g/dL — ABNORMAL HIGH (ref 12.0–15.0)
POTASSIUM: 4.6 mmol/L (ref 3.5–5.1)
SODIUM: 139 mmol/L (ref 135–145)
TCO2: 24 mmol/L (ref 0–100)

## 2015-09-30 MED ORDER — DEXTROSE 5 % IV SOLN
1.0000 g | Freq: Once | INTRAVENOUS | Status: DC
  Filled 2015-09-30: qty 10

## 2015-09-30 MED ORDER — HYDROMORPHONE HCL 1 MG/ML IJ SOLN
1.0000 mg | Freq: Once | INTRAMUSCULAR | Status: AC
  Administered 2015-09-30: 1 mg via INTRAVENOUS
  Filled 2015-09-30: qty 1

## 2015-09-30 MED ORDER — KETOROLAC TROMETHAMINE 30 MG/ML IJ SOLN
30.0000 mg | Freq: Once | INTRAMUSCULAR | Status: AC
  Administered 2015-09-30: 30 mg via INTRAVENOUS
  Filled 2015-09-30: qty 1

## 2015-09-30 MED ORDER — CIPROFLOXACIN HCL 500 MG PO TABS: 500.0000 mg | ORAL_TABLET | Freq: Two times a day (BID) | ORAL | Status: AC

## 2015-09-30 MED ORDER — OXYBUTYNIN CHLORIDE 5 MG PO TABS: 5.0000 mg | ORAL_TABLET | Freq: Three times a day (TID) | ORAL | Status: DC | PRN

## 2015-09-30 MED ORDER — PHENAZOPYRIDINE HCL 100 MG PO TABS: 100.0000 mg | ORAL_TABLET | Freq: Three times a day (TID) | ORAL | Status: DC

## 2015-09-30 MED ORDER — ONDANSETRON 4 MG PO TBDP: 4.0000 mg | ORAL_TABLET | Freq: Three times a day (TID) | ORAL | Status: DC | PRN

## 2015-09-30 MED ORDER — ONDANSETRON HCL 4 MG/2ML IJ SOLN
4.0000 mg | Freq: Once | INTRAMUSCULAR | Status: AC
  Administered 2015-09-30: 4 mg via INTRAVENOUS
  Filled 2015-09-30: qty 2

## 2015-09-30 MED ORDER — OXYBUTYNIN CHLORIDE 5 MG PO TABS
5.0000 mg | ORAL_TABLET | Freq: Three times a day (TID) | ORAL | Status: DC
  Administered 2015-09-30: 5 mg via ORAL
  Filled 2015-09-30 (×3): qty 1

## 2015-09-30 MED ORDER — SODIUM CHLORIDE 0.9 % IV BOLUS (SEPSIS)
1000.0000 mL | Freq: Once | INTRAVENOUS | Status: AC
  Administered 2015-09-30: 1000 mL via INTRAVENOUS

## 2015-09-30 NOTE — ED Notes (Signed)
Patient states she is breastfeeding and needs to pump at this time. Patient does not have breast pump with her, advised patient I will call for one to be brought to the ER.

## 2015-09-30 NOTE — ED Notes (Signed)
Bed: [id number] Expected date:  Expected time:  Means of arrival:  Comments: Nurse covering 22

## 2015-09-30 NOTE — ED Notes (Addendum)
Patient states she had a stent placed on 09/29/2015 to left. Patient states she is now having pain to the right flank that "feels like a kidney stone". Patient states she is also vomiting and unable to keep her medications down. Patient was brought to the ER by her husband who remains in the vehicle with a small child. Patient is seen at [facility]. Patient states pain and vomiting started around midnight. Patient states her urine and her vomit "look the same", it's bloody looking.

## 2015-09-30 NOTE — ED Notes (Signed)
Patient assisted with the use of breast pump. Patient is aware of needed urine specimen.

## 2015-09-30 NOTE — ED Provider Notes (Signed)
CSN: [id number]     Arrival date & time 09/30/15  0522 History   First MD Initiated Contact with Patient 09/30/15 0703     Chief Complaint  Patient presents with  . Flank Pain    right, s/p stent placement on 09/29/2015     (Consider location/radiation/quality/duration/timing/severity/associated sxs/prior Treatment) HPI Comments: Felt like kidney stone, right flank pain, since stent placed yesterday and stone removed Severe pain, right flank and feeling of bladder spasms Pain RLQ radiating around to right lower back Pain worse with urination Dilaudid rx, zofran odt only minimal relief Still vomiting Emesis dark yellow    Patient is a 40 y.o. female presenting with flank pain.  Flank Pain Associated symptoms include abdominal pain. Pertinent negatives include no chest pain, no headaches and no shortness of breath.    Past Medical History  Diagnosis Date  . Seasonal allergies   . Heart murmur    Past Surgical History  Procedure Laterality Date  . Tonsillectomy and adenoidectomy  1994  . Right stent placed      Right Renal Stent   Family History  Problem Relation Age of Onset  . Arthritis Mother   . Hypertension Mother   . Hypertension Father   . Alcohol abuse Maternal Uncle   . Heart disease Maternal Grandfather   . Breast cancer Paternal Grandmother    Social History  Substance Use Topics  . Smoking status: Never Smoker   . Smokeless tobacco: Never Used  . Alcohol Use: No   OB History    No data available     Review of Systems  Constitutional: Negative for fever.  HENT: Negative for sore throat.   Eyes: Negative for visual disturbance.  Respiratory: Negative for cough and shortness of breath.   Cardiovascular: Negative for chest pain.  Gastrointestinal: Positive for nausea, vomiting (x4), abdominal pain and constipation.  Genitourinary: Positive for dysuria, hematuria and flank pain. Negative for vaginal bleeding, vaginal discharge and difficulty  urinating.  Musculoskeletal: Negative for back pain and neck pain.  Skin: Negative for rash.  Neurological: Negative for syncope and headaches.      Allergies  Review of patient's allergies indicates no known allergies.  Home Medications   Prior to Admission medications   Medication Sig Start Date End Date Taking? Authorizing Provider  HYDROmorphone (DILAUDID) 2 MG tablet Take 2-4 mg by mouth every 6 (six) hours as needed for severe pain.   Yes Historical Provider, MD  ibuprofen (ADVIL,MOTRIN) 600 MG tablet Take 600 mg by mouth every 6 (six) hours as needed for moderate pain.    Yes Historical Provider, MD  ondansetron (ZOFRAN) 4 MG tablet Take 1 tablet (4 mg total) by mouth every 6 (six) hours. Prn n/v. Patient taking differently: Take 4 mg by mouth every 6 (six) hours as needed for nausea. Prn n/v. 09/22/15  Yes Billy Fischer, MD  Prenatal Vit-Fe Fumarate-FA (MULTIVITAMIN-PRENATAL) 27-0.8 MG TABS tablet Take 1 tablet by mouth daily at 12 noon.   Yes Historical Provider, MD  Probiotic Product (PROBIOTIC ADVANCED PO) Take 1 capsule by mouth daily.   Yes Historical Provider, MD  triamcinolone (KENALOG) 0.025 % cream Apply 1 application topically daily as needed (for eczema).   Yes Historical Provider, MD  ciprofloxacin (CIPRO) 500 MG tablet Take 1 tablet (500 mg total) by mouth every 12 (twelve) hours. 09/30/15 10/14/15  Gareth Morgan, MD  HYDROcodone-acetaminophen (NORCO/VICODIN) 5-325 MG tablet Take 1 tablet by mouth every 4 (four) hours as needed. Patient not taking:  Reported on 09/30/2015 09/22/15   Billy Fischer, MD  ondansetron (ZOFRAN ODT) 4 MG disintegrating tablet Take 1 tablet (4 mg total) by mouth every 8 (eight) hours as needed for nausea or vomiting. 09/30/15   Gareth Morgan, MD  oxybutynin (DITROPAN) 5 MG tablet Take 1 tablet (5 mg total) by mouth every 8 (eight) hours as needed for bladder spasms. 09/30/15   Gareth Morgan, MD  tamsulosin (FLOMAX) 0.4 MG CAPS capsule Take 1  capsule (0.4 mg total) by mouth daily. Patient not taking: Reported on 09/27/2015 09/22/15   Billy Fischer, MD   BP 115/70 mmHg  Pulse 56  Temp(Src) 98 F (36.7 C) (Oral)  Resp 17  Ht 5\' 3"  (1.6 m)  Wt 186 lb (84.369 kg)  BMI 32.96 kg/m2  SpO2 100%  LMP 09/05/2015 Physical Exam  Constitutional: She is oriented to person, place, and time. She appears well-developed and well-nourished. No distress.  HENT:  Head: Normocephalic and atraumatic.  Eyes: Conjunctivae and EOM are normal.  Neck: Normal range of motion.  Cardiovascular: Normal rate, regular rhythm, normal heart sounds and intact distal pulses.  Exam reveals no gallop and no friction rub.   No murmur heard. Pulmonary/Chest: Effort normal and breath sounds normal. No respiratory distress. She has no wheezes. She has no rales.  Abdominal: Soft. She exhibits no distension. There is tenderness (rlq). There is no guarding.  Musculoskeletal: She exhibits no edema or tenderness.  Neurological: She is alert and oriented to person, place, and time.  Skin: Skin is warm and dry. No rash noted. She is not diaphoretic. No erythema.  Nursing note and vitals reviewed.   ED Course  Procedures (including critical care time) Labs Review Labs Reviewed  URINALYSIS, ROUTINE W REFLEX MICROSCOPIC (NOT AT [facility]) - Abnormal; Notable for the following:    Color, Urine RED (*)    APPearance TURBID (*)    Hgb urine dipstick LARGE (*)    Bilirubin Urine SMALL (*)    Ketones, ur 40 (*)    Protein, ur 100 (*)    Nitrite POSITIVE (*)    Leukocytes, UA MODERATE (*)    All other components within normal limits  URINE MICROSCOPIC-ADD ON - Abnormal; Notable for the following:    Bacteria, UA FEW (*)    All other components within normal limits  I-STAT CHEM 8, ED - Abnormal; Notable for the following:    Glucose, Bld 108 (*)    Hemoglobin 16.0 (*)    HCT 47.0 (*)    All other components within normal limits  CBC WITH DIFFERENTIAL/PLATELET  POC URINE  PREG, ED  POC URINE PREG, ED    Imaging Review Dg Abd 1 View  09/30/2015  CLINICAL DATA:  Nausea and vomiting. EXAM: ABDOMEN - 1 VIEW COMPARISON:  None. FINDINGS: There is a right-sided ureteral stent. The superior pigtail is likely within the right renal pelvis. The distal pigtail has not formed. There is a wire extending out of the distal aspect of the stent extending inferiorly at midline. Recommend clinical correlation. No stones are seen along the course of the right ureteral stent. The kidneys are otherwise not well assessed due to overlying bowel contents. No convincing evidence of renal or ureteral stones. Bones and soft tissues otherwise normal. IMPRESSION: The distal pigtail of the right ureteral stent is not formed. A wire appears to extend out of the distal aspect of the stent extending inferiorly. Recommend clinical correlation. Electronically Signed   By: Dorise Bullion  III M.D   On: 09/30/2015 08:00   Kub Day Of Procedure  09/29/2015  CLINICAL DATA:  Preop for right ureteral stone EXAM: ABDOMEN - 1 VIEW COMPARISON:  09/26/2015 FINDINGS: There is normal small bowel gas pattern. Study is limited by moderate stool throughout the colon. No definite renal calcifications are identified. Again noted 4 mm calcification in right pelvis suspicious for distal right ureteral calculus. IMPRESSION: No definite renal calcifications are identified. Again noted 4 mm calcification in right pelvis suspicious for distal right ureteral calculus. Electronically Signed   By: Lahoma Crocker M.D.   On: 09/29/2015 09:58   US Renal  09/30/2015  CLINICAL DATA:  Right flank pain x1 day, right ureteral stent EXAM: RENAL / URINARY TRACT ULTRASOUND COMPLETE COMPARISON:  CT abdomen pelvis dated 09/26/2015 FINDINGS: Right Kidney: Length: 11.4 cm. 4 mm interpolar calculus. Right ureteral stent. No hydronephrosis. Left Kidney: Length: 11.8 cm.  No mass or hydronephrosis. Bladder: Within normal limits. IMPRESSION: 4 mm  interpolar right renal calculus. Right ureteral stent.  No hydronephrosis. Electronically Signed   By: Julian Hy M.D.   On: 09/30/2015 11:35   I have personally reviewed and evaluated these images and lab results as part of my medical decision-making.   EKG Interpretation None      MDM   Final diagnoses:  Pain due to ureteral stent, initial encounter [facility])  Urinary tract infection with hematuria, site unspecified   40 year old female with a history of right ureteral stent placement and stone removal yesterday, presents with concern of right flank pain.  Labs obtained showed normal creatinine and normal hemoglobin.  Patient reports emesis with question of blood, however has normal vital signs, normal hemoglobin, no black/bloody stools and doubt clinically significant upper GI bleed.  XR obtained showing no signs of stent migration.  US shows no hydronephrosis, and evidence of nephrolithiasis.  Urinalysis concerning for infection.  Patient likely with post-operative stent colic and UTI. Given IV fluids, oxybutinin and toradol with improvement in pain.  Recommend continued hydration, pain control with oxybutinin, cipro for infxn, close Urology follow up.Patient discharged in stable condition with understanding of reasons to return.   Gareth Morgan, MD 09/30/15 2023

## 2015-09-30 NOTE — ED Notes (Signed)
Ultrasound at bedside

## 2015-10-02 ENCOUNTER — Encounter (HOSPITAL_COMMUNITY): Payer: Self-pay | Admitting: Urology

## 2016-10-07 ENCOUNTER — Encounter: Payer: Self-pay | Admitting: Family Medicine

## 2016-10-07 ENCOUNTER — Ambulatory Visit (INDEPENDENT_AMBULATORY_CARE_PROVIDER_SITE_OTHER): Payer: BC Managed Care – PPO | Admitting: Family Medicine

## 2016-10-07 ENCOUNTER — Ambulatory Visit: Payer: BC Managed Care – PPO | Admitting: Family Medicine

## 2016-10-07 VITALS — BP 100/80 | HR 86 | Temp 99.1°F | Ht 63.0 in | Wt 187.6 lb

## 2016-10-07 DIAGNOSIS — J069 Acute upper respiratory infection, unspecified: Secondary | ICD-10-CM | POA: Diagnosis not present

## 2016-10-07 LAB — POC INFLUENZA A&B (BINAX/QUICKVUE)
INFLUENZA B, POC: NEGATIVE
Influenza A, POC: NEGATIVE

## 2016-10-07 MED ORDER — BENZONATATE 100 MG PO CAPS: 100.0000 mg | ORAL_CAPSULE | Freq: Two times a day (BID) | ORAL | 0 refills | Status: DC | PRN

## 2016-10-07 NOTE — Progress Notes (Signed)
Pre visit review using our clinic review tool, if applicable. No additional management support is needed unless otherwise documented below in the visit note. 

## 2016-10-07 NOTE — Progress Notes (Signed)
HPI:  Acute visit for URI: -started: 2-3 days ago, daughter and mother with similar symptoms -symptoms:nasal congestion, sore throat, cough, ear feel full, low grade temp -denies:fever, SOB, NVD, tooth pain, sinus pain -has tried: decongestant - helps -sick contacts/travel/risks: no reported flu, strep or tick exposure  ROS: See pertinent positives and negatives per HPI.  Past Medical History:  Diagnosis Date  . Heart murmur   . Seasonal allergies     Past Surgical History:  Procedure Laterality Date  . CYSTOSCOPY WITH URETEROSCOPY AND STENT PLACEMENT Right 09/29/2015   Procedure: RIGHT  URETEROSCOPY AND STENT PLACEMENT, RETROGRADE;  Surgeon: Kathie Rhodes, MD;  Location: [facility];  Service: Urology;  Laterality: Right;  . HOLMIUM LASER APPLICATION Right 10/17/9240   Procedure: WITH HOLMIUM LASER;  Surgeon: Kathie Rhodes, MD;  Location: [facility];  Service: Urology;  Laterality: Right;  . right stent placed     Right Renal Stent  . TONSILLECTOMY AND ADENOIDECTOMY  1994    Family History  Problem Relation Age of Onset  . Arthritis Mother   . Hypertension Mother   . Hypertension Father   . Alcohol abuse Maternal Uncle   . Heart disease Maternal Grandfather   . Breast cancer Paternal Grandmother     Social History   Social History  . Marital status: Married    Spouse name: N/A  . Number of children: N/A  . Years of education: N/A   Social History Main Topics  . Smoking status: Never Smoker  . Smokeless tobacco: Never Used  . Alcohol use No  . Drug use: No  . Sexual activity: Yes    Birth control/ protection: None   Other Topics Concern  . None   Social History Narrative   Married.   1 daughter.    Works as a Agricultural engineer.   Enjoys spending time with her family, working out.      Current Outpatient Prescriptions:  .  Prenatal Vit-Fe Fumarate-FA (MULTIVITAMIN-PRENATAL) 27-0.8 MG TABS tablet, Take 1 tablet by mouth daily at 12 noon., Disp: , Rfl:  .  Probiotic  Product (PROBIOTIC ADVANCED PO), Take 1 capsule by mouth daily., Disp: , Rfl:  .  triamcinolone (KENALOG) 0.025 % cream, Apply 1 application topically daily as needed (for eczema)., Disp: , Rfl:  .  benzonatate (TESSALON) 100 MG capsule, Take 1 capsule (100 mg total) by mouth 2 (two) times daily as needed for cough., Disp: 20 capsule, Rfl: 0  EXAM:  Vitals:   10/07/16 1704  BP: 100/80  Pulse: 86  Temp: 99.1 F (37.3 C)    Body mass index is 33.23 kg/m.  GENERAL: vitals reviewed and listed above, alert, oriented, appears well hydrated and in no acute distress  HEENT: atraumatic, conjunttiva clear, no obvious abnormalities on inspection of external nose and ears, normal appearance of ear canals and TMs, clear nasal congestion, mild post oropharyngeal erythema with PND, no tonsillar edema or exudate, no sinus TTP  NECK: no obvious masses on inspection  LUNGS: clear to auscultation bilaterally, no wheezes, rales or rhonchi, good air movement  CV: HRRR, no peripheral edema  MS: moves all extremities without noticeable abnormality  PSYCH: pleasant and cooperative, no obvious depression or anxiety  ASSESSMENT AND PLAN:  Discussed the following assessment and plan:  Upper respiratory tract infection, unspecified type  -given HPI and exam findings today, a serious infection or illness is unlikely. We discussed potential etiologies, with VURI being most likely, and advised supportive care and monitoring and tessalon  for cough. Potential for mild flu and she wanted to do rapid flu test. We discussed treatment side effects, likely course, antibiotic misuse, transmission, and signs of developing a serious illness. -of course, we advised to return or notify a doctor immediately if symptoms worsen or persist or new concerns arise.    Patient Instructions  INSTRUCTIONS FOR UPPER RESPIRATORY INFECTION:  -plenty of rest and fluids  -nasal saline wash 2-3 times daily (use prepackaged  nasal saline or bottled/distilled water if making your own)   -can use AFRIN nasal spray for drainage and nasal congestion - but do NOT use longer then 3-4 days  -can use tylenol (in no history of liver disease) or ibuprofen (if no history of kidney disease, bowel bleeding or significant heart disease) as directed for aches and sorethroat  -in the winter time, using a humidifier at night is helpful (please follow cleaning instructions)  -if you are taking a cough medication - use only as directed, may also try a teaspoon of honey to coat the throat and throat lozenges. Sent some tessalon for cough. -for sore throat, salt water gargles can help  -follow up if you have fevers, facial pain, tooth pain, difficulty breathing or are worsening or symptoms persist longer then expected  Upper Respiratory Infection, Adult An upper respiratory infection (URI) is also known as the common cold. It is often caused by a type of germ (virus). Colds are easily spread (contagious). You can pass it to others by kissing, coughing, sneezing, or drinking out of the same glass. Usually, you get better in 1 to 3  weeks.  However, the cough can last for even longer. HOME CARE   Only take medicine as told by your doctor. Follow instructions provided above.  Drink enough water and fluids to keep your pee (urine) clear or pale yellow.  Get plenty of rest.  Return to work when your temperature is < 100 for 24 hours or as told by your doctor. You may use a face mask and wash your hands to stop your cold from spreading. GET HELP RIGHT AWAY IF:   After the first few days, you feel you are getting worse.  You have questions about your medicine.  You have chills, shortness of breath, or red spit (mucus).  You have pain in the face for more then 1-2 days, especially when you bend forward.  You have a fever, puffy (swollen) neck, pain when you swallow, or white spots in the back of your throat.  You have a bad  headache, ear pain, sinus pain, or chest pain.  You have a high-pitched whistling sound when you breathe in and out (wheezing).  You cough up blood.  You have sore muscles or a stiff neck. MAKE SURE YOU:   Understand these instructions.  Will watch your condition.  Will get help right away if you are not doing well or get worse. Document Released: 12/25/2007 Document Revised: 09/30/2011 Document Reviewed: 10/13/2013 [facility] Patient Information 2015 [place], [place]. This information is not intended to replace advice given to you by your health care provider. Make sure you discuss any questions you have with your health care provider.    Colin Benton R., DO

## 2016-10-07 NOTE — Patient Instructions (Signed)
INSTRUCTIONS FOR UPPER RESPIRATORY INFECTION:  -plenty of rest and fluids  -nasal saline wash 2-3 times daily (use prepackaged nasal saline or bottled/distilled water if making your own)   -can use AFRIN nasal spray for drainage and nasal congestion - but do NOT use longer then 3-4 days  -can use tylenol (in no history of liver disease) or ibuprofen (if no history of kidney disease, bowel bleeding or significant heart disease) as directed for aches and sorethroat  -in the winter time, using a humidifier at night is helpful (please follow cleaning instructions)  -if you are taking a cough medication - use only as directed, may also try a teaspoon of honey to coat the throat and throat lozenges. Sent some tessalon for cough. -for sore throat, salt water gargles can help  -follow up if you have fevers, facial pain, tooth pain, difficulty breathing or are worsening or symptoms persist longer then expected  Upper Respiratory Infection, Adult An upper respiratory infection (URI) is also known as the common cold. It is often caused by a type of germ (virus). Colds are easily spread (contagious). You can pass it to others by kissing, coughing, sneezing, or drinking out of the same glass. Usually, you get better in 1 to 3  weeks.  However, the cough can last for even longer. HOME CARE   Only take medicine as told by your doctor. Follow instructions provided above.  Drink enough water and fluids to keep your pee (urine) clear or pale yellow.  Get plenty of rest.  Return to work when your temperature is < 100 for 24 hours or as told by your doctor. You may use a face mask and wash your hands to stop your cold from spreading. GET HELP RIGHT AWAY IF:   After the first few days, you feel you are getting worse.  You have questions about your medicine.  You have chills, shortness of breath, or red spit (mucus).  You have pain in the face for more then 1-2 days, especially when you bend  forward.  You have a fever, puffy (swollen) neck, pain when you swallow, or white spots in the back of your throat.  You have a bad headache, ear pain, sinus pain, or chest pain.  You have a high-pitched whistling sound when you breathe in and out (wheezing).  You cough up blood.  You have sore muscles or a stiff neck. MAKE SURE YOU:   Understand these instructions.  Will watch your condition.  Will get help right away if you are not doing well or get worse. Document Released: 12/25/2007 Document Revised: 09/30/2011 Document Reviewed: 10/13/2013 [facility] Patient Information 2015 [place], [place]. This information is not intended to replace advice given to you by your health care provider. Make sure you discuss any questions you have with your health care provider.

## 2016-10-07 NOTE — Addendum Note (Signed)
Addended by: Agnes Lawrence on: 10/07/2016 05:37 PM   Modules accepted: Orders

## 2016-10-10 ENCOUNTER — Ambulatory Visit (INDEPENDENT_AMBULATORY_CARE_PROVIDER_SITE_OTHER): Payer: BC Managed Care – PPO | Admitting: Family Medicine

## 2016-10-10 ENCOUNTER — Encounter: Payer: Self-pay | Admitting: Family Medicine

## 2016-10-10 VITALS — BP 122/88 | HR 88 | Temp 98.3°F | Wt 183.0 lb

## 2016-10-10 DIAGNOSIS — J069 Acute upper respiratory infection, unspecified: Secondary | ICD-10-CM

## 2016-10-10 MED ORDER — HYDROCOD POLST-CPM POLST ER 10-8 MG/5ML PO SUER: 5.0000 mL | Freq: Two times a day (BID) | ORAL | 0 refills | Status: DC | PRN

## 2016-10-10 MED ORDER — AZITHROMYCIN 250 MG PO TABS: ORAL_TABLET | ORAL | 0 refills | Status: DC

## 2016-10-10 NOTE — Progress Notes (Signed)
SUBJECTIVE:  Katelyn Wright is a 41 y.o. female pt of Katelyn Wright, new to me, who complains of coryza, congestion, productive cough, myalgias and fever for 1 week.  She denies a history of dizziness, nausea and wheezing and denies a history of asthma. Patient denies smoke cigarettes.   Saw Dr. Maudie Mercury on 10/07/16.  Note reviewed. Advised symptoms likely viral and supportive care was recommended. Was also given tessalon which has not helped much.  Current Outpatient Prescriptions on File Prior to Visit  Medication Sig Dispense Refill  . benzonatate (TESSALON) 100 MG capsule Take 1 capsule (100 mg total) by mouth 2 (two) times daily as needed for cough. 20 capsule 0  . Prenatal Vit-Fe Fumarate-FA (MULTIVITAMIN-PRENATAL) 27-0.8 MG TABS tablet Take 1 tablet by mouth daily at 12 noon.    . Probiotic Product (PROBIOTIC ADVANCED PO) Take 1 capsule by mouth daily.    Marland Kitchen triamcinolone (KENALOG) 0.025 % cream Apply 1 application topically daily as needed (for eczema).     No current facility-administered medications on file prior to visit.     Allergies  Allergen Reactions  . Cefdinir Rash    Past Medical History:  Diagnosis Date  . Heart murmur   . Seasonal allergies     Past Surgical History:  Procedure Laterality Date  . CYSTOSCOPY WITH URETEROSCOPY AND STENT PLACEMENT Right 09/29/2015   Procedure: RIGHT  URETEROSCOPY AND STENT PLACEMENT, RETROGRADE;  Surgeon: Kathie Rhodes, MD;  Location: [facility];  Service: Urology;  Laterality: Right;  . HOLMIUM LASER APPLICATION Right 0/62/3762   Procedure: WITH HOLMIUM LASER;  Surgeon: Kathie Rhodes, MD;  Location: [facility];  Service: Urology;  Laterality: Right;  . right stent placed     Right Renal Stent  . TONSILLECTOMY AND ADENOIDECTOMY  1994    Family History  Problem Relation Age of Onset  . Arthritis Mother   . Hypertension Mother   . Hypertension Father   . Alcohol abuse Maternal Uncle   . Heart disease Maternal Grandfather   . Breast cancer  Paternal Grandmother     Social History   Social History  . Marital status: Married    Spouse name: N/A  . Number of children: N/A  . Years of education: N/A   Occupational History  . Not on file.   Social History Main Topics  . Smoking status: Never Smoker  . Smokeless tobacco: Never Used  . Alcohol use No  . Drug use: No  . Sexual activity: Yes    Birth control/ protection: None   Other Topics Concern  . Not on file   Social History Narrative   Married.   1 daughter.    Works as a Agricultural engineer.   Enjoys spending time with her family, working out.    The PMH, PSH, Social History, Family History, Medications, and allergies have been reviewed in [facility], and have been updated if relevant.  OBJECTIVE: BP 122/88   Pulse 88   Temp 98.3 F (36.8 C)   Wt 183 lb (83 kg)   SpO2 97%   BMI 32.42 kg/m   She appears well, vital signs are as noted. Ears normal.  Throat and pharynx normal.  Neck supple. No adenopathy in the neck. Nose is congested. Sinuses tender. Wet cough with scattered wheezes.  ASSESSMENT:  sinusitis and bronchitis  PLAN: Given duration and progression of symptoms, will treat for bacterial sinusitis/bronchitis with Zpack, tussionex.  Symptomatic therapy suggested: push fluids, rest and return office visit prn if symptoms persist  or worsen.Call or return to clinic prn if these symptoms worsen or fail to improve as anticipated.

## 2016-10-10 NOTE — Patient Instructions (Signed)
Great to see you.   Please take zpack as directed.   As needed tussionex for cough.

## 2016-12-30 ENCOUNTER — Ambulatory Visit (INDEPENDENT_AMBULATORY_CARE_PROVIDER_SITE_OTHER): Payer: BC Managed Care – PPO | Admitting: Family Medicine

## 2016-12-30 ENCOUNTER — Encounter: Payer: Self-pay | Admitting: Family Medicine

## 2016-12-30 VITALS — BP 125/85 | HR 66 | Ht 63.0 in | Wt 196.0 lb

## 2016-12-30 DIAGNOSIS — L309 Dermatitis, unspecified: Secondary | ICD-10-CM

## 2016-12-30 DIAGNOSIS — E669 Obesity, unspecified: Secondary | ICD-10-CM

## 2016-12-30 DIAGNOSIS — T7840XA Allergy, unspecified, initial encounter: Secondary | ICD-10-CM | POA: Insufficient documentation

## 2016-12-30 DIAGNOSIS — D229 Melanocytic nevi, unspecified: Secondary | ICD-10-CM | POA: Insufficient documentation

## 2016-12-30 DIAGNOSIS — T7840XS Allergy, unspecified, sequela: Secondary | ICD-10-CM

## 2016-12-30 NOTE — Patient Instructions (Addendum)
Please call your insurance and find out who is approved in who on our list of counselors in the areas in your network.  Please call for an appointment.  Going for cognitive behavioral therapy and/or seeing a life coach is a very positive good thing for everyone.   Please make a follow-up appointment for fasting blood work in the next week or so.  Then appointment following up with me to discuss those results.    Please realize, EXERCISE IS MEDICINE!  -  American Heart Association [facility]) guidelines for exercise : If you are in good health, without any medical conditions, you should engage in 150 minutes of moderate intensity aerobic activity per week.  This means you should be huffing and puffing throughout your workout.   Engaging in regular exercise will improve brain function and memory, as well as improve mood, boost immune system and help with weight management.  As well as the other, more well-known effects of exercise such as decreasing blood sugar levels, decreasing blood pressure,  and decreasing bad cholesterol levels/ increasing good cholesterol levels.     -  The AHA strongly endorses consumption of a diet that contains a variety of foods from all the food categories with an emphasis on fruits and vegetables; fat-free and low-fat dairy products; cereal and grain products; legumes and nuts; and fish, poultry, and/or extra lean meats.    Excessive food intake, especially of foods high in saturated and trans fats, sugar, and salt, should be avoided.    Adequate water intake of roughly 1/2 of your weight in pounds, should equal the ounces of water per day you should drink.  So for instance, if you're 200 pounds, that would be 100 ounces of water per day.         Mediterranean Diet  Why follow it? Research shows. . Those who follow the Mediterranean diet have a reduced risk of heart disease  . The diet is associated with a reduced incidence of Parkinson's and Alzheimer's  diseases . People following the diet may have longer life expectancies and lower rates of chronic diseases  . The Dietary Guidelines for Americans recommends the Mediterranean diet as an eating plan to promote health and prevent disease  What Is the Mediterranean Diet?  . Healthy eating plan based on typical foods and recipes of Mediterranean-style cooking . The diet is primarily a plant based diet; these foods should make up a majority of meals   Starches - Plant based foods should make up a majority of meals - They are an important sources of vitamins, minerals, energy, antioxidants, and fiber - Choose whole grains, foods high in fiber and minimally processed items  - Typical grain sources include wheat, oats, barley, corn, brown rice, bulgar, farro, millet, polenta, couscous  - Various types of beans include chickpeas, lentils, fava beans, black beans, white beans   Fruits  Veggies - Large quantities of antioxidant rich fruits & veggies; 6 or more servings  - Vegetables can be eaten raw or lightly drizzled with oil and cooked  - Vegetables common to the traditional Mediterranean Diet include: artichokes, arugula, beets, broccoli, brussel sprouts, cabbage, carrots, celery, collard greens, cucumbers, eggplant, kale, leeks, lemons, lettuce, mushrooms, okra, onions, peas, peppers, potatoes, pumpkin, radishes, rutabaga, shallots, spinach, sweet potatoes, turnips, zucchini - Fruits common to the Mediterranean Diet include: apples, apricots, avocados, cherries, clementines, dates, figs, grapefruits, grapes, melons, nectarines, oranges, peaches, pears, pomegranates, strawberries, tangerines  Fats - Replace butter and margarine with healthy  oils, such as olive oil, canola oil, and tahini  - Limit nuts to no more than a handful a day  - Nuts include walnuts, almonds, pecans, pistachios, pine nuts  - Limit or avoid candied, honey roasted or heavily salted nuts - Olives are central to the Mediterranean  diet - can be eaten whole or used in a variety of dishes   Meats Protein - Limiting red meat: no more than a few times a month - When eating red meat: choose lean cuts and keep the portion to the size of deck of cards - Eggs: approx. 0 to 4 times a week  - Fish and lean poultry: at least 2 a week  - Healthy protein sources include, chicken, [place], lean beef, lamb - Increase intake of seafood such as tuna, salmon, trout, mackerel, shrimp, scallops - Avoid or limit high fat processed meats such as sausage and bacon  Dairy - Include moderate amounts of low fat dairy products  - Focus on healthy dairy such as fat free yogurt, skim milk, low or reduced fat cheese - Limit dairy products higher in fat such as whole or 2% milk, cheese, ice cream  Alcohol - Moderate amounts of red wine is ok  - No more than 5 oz daily for women (all ages) and men older than age 18  - No more than 10 oz of wine daily for men younger than 64  Other - Limit sweets and other desserts  - Use herbs and spices instead of salt to flavor foods  - Herbs and spices common to the traditional Mediterranean Diet include: basil, bay leaves, chives, cloves, cumin, fennel, garlic, lavender, marjoram, mint, oregano, parsley, pepper, rosemary, sage, savory, sumac, tarragon, thyme   It's not just a diet, it's a lifestyle:  . The Mediterranean diet includes lifestyle factors typical of those in the region  . Foods, drinks and meals are best eaten with others and savored . Daily physical activity is important for overall good health . This could be strenuous exercise like running and aerobics . This could also be more leisurely activities such as walking, housework, yard-work, or taking the stairs . Moderation is the key; a balanced and healthy diet accommodates most foods and drinks . Consider portion sizes and frequency of consumption of certain foods   Meal Ideas & Options:  . Breakfast:  o Whole wheat toast or whole wheat English  muffins with peanut butter & hard boiled egg o Steel cut oats topped with apples & cinnamon and skim milk  o Fresh fruit: banana, strawberries, melon, berries, peaches  o Smoothies: strawberries, bananas, greek yogurt, peanut butter o Low fat greek yogurt with blueberries and granola  o Egg white omelet with spinach and mushrooms o Breakfast couscous: whole wheat couscous, apricots, skim milk, cranberries  . Sandwiches:  o Hummus and grilled vegetables (peppers, zucchini, squash) on whole wheat bread   o Grilled chicken on whole wheat pita with lettuce, tomatoes, cucumbers or tzatziki  o Tuna salad on whole wheat bread: tuna salad made with greek yogurt, olives, red peppers, capers, green onions o Garlic rosemary lamb pita: lamb sauted with garlic, rosemary, salt & pepper; add lettuce, cucumber, greek yogurt to pita - flavor with lemon juice and black pepper  . Seafood:  o Mediterranean grilled salmon, seasoned with garlic, basil, parsley, lemon juice and black pepper o Shrimp, lemon, and spinach whole-grain pasta salad made with low fat greek yogurt  o Seared scallops with lemon orzo  o Seared tuna steaks seasoned salt, pepper, coriander topped with tomato mixture of olives, tomatoes, olive oil, minced garlic, parsley, green onions and cappers  . Meats:  o Herbed greek chicken salad with kalamata olives, cucumber, feta  o Red bell peppers stuffed with spinach, bulgur, lean ground beef (or lentils) & topped with feta   o Kebabs: skewers of chicken, tomatoes, onions, zucchini, squash  o [place] burgers: made with red onions, mint, dill, lemon juice, feta cheese topped with roasted red peppers . Vegetarian o Cucumber salad: cucumbers, artichoke hearts, celery, red onion, feta cheese, tossed in olive oil & lemon juice  o Hummus and whole grain pita points with a greek salad (lettuce, tomato, feta, olives, cucumbers, red onion) o Lentil soup with celery, carrots made with vegetable broth,  garlic, salt and pepper  o Tabouli salad: parsley, bulgur, mint, scallions, cucumbers, tomato, radishes, lemon juice, olive oil, salt and pepper.

## 2016-12-30 NOTE — Progress Notes (Signed)
New patient office visit note:  Impression and Recommendations:    1. Obesity (BMI 30.0-34.9)   2. h/o Kidney stones      Obesity (BMI 30.0-34.9) Explained to patient what BMI refers to, and what it means medically.    Told patient to think about it as a "medical risk stratification measurement" and how increasing BMI is associated with increasing risk/ or worsening state of various diseases such as hypertension, hyperlipidemia, diabetes, premature OA, depression etc.  American Heart Association guidelines for healthy diet, basically Mediterranean diet, and exercise guidelines of 30 minutes 5 days per week or more discussed in detail.  Health counseling performed.  All questions answered.   The patient was counseled, risk factors were discussed, anticipatory guidance given.   New Prescriptions   No medications on file     Discontinued Medications   No medications on file      No orders of the defined types were placed in this encounter.    Gross side effects, risk and benefits, and alternatives of medications discussed with patient.  Patient is aware that all medications have potential side effects and we are unable to predict every side effect or drug-drug interaction that may occur.  Expresses verbal understanding and consents to current therapy plan and treatment regimen.  No Follow-up on file.  Please see AVS handed out to patient at the end of our visit for further patient instructions/ counseling done pertaining to today's office visit.    Note: This document was prepared using Dragon voice recognition software and may include unintentional dictation errors.  ----------------------------------------------------------------------------------------------------------------------    Subjective:    Chief complaint:  No chief complaint on file.    HPI: Katelyn Wright is a pleasant 41 y.o. female who presents to [place] at [facility] today  to review their medical history with me and establish care.   I asked the patient to review their chronic problem list with me to ensure everything was updated and accurate.    All recent office visits with other providers, any medical records that patient brought in etc  - I reviewed today.     Also asked pt to get me medical records from [facility] providers/ specialists that they had seen within the past 3-5 years- if they are in private practice and/or do not work for a [employer], [facility], [place], [place] or [employer] owned practice.  Told them to call their specialists to clarify this if they are not sure.   Periods irreg, extremely heavy and thick, clotty,   Going to her again early July.   Jts hurting esp feet; diff to raise L arm esp at night and now R arm. Shooting pain from armpit to elbow inside aspect.   irreg heartbeat occ- started 1.33mo ago.  Occurs 2-3 times per week. Lasts 2 sec or so.  Feels like a "heart burp".  Like a hiccup in her heart beats.  NO assoc sx.  Exercises- no problem and asx.  H/o vegan diet.   --> 2 yo child. 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  And prior gained 25lbs prior to pregnancy.    Ketogenic diet--> last yr --> did not feel well though- constipated,  Down to 173lbs at that time.   GYN- [place].  Gym- 3-4 times per week- cardio 30-39min and wts.      Problem  Obesity (Bmi 30.0-34.9)  Changing Pigmented Skin Lesion  h/o Kidney stones   S/p ureteroscopic extraction of her right distal ureteral stone.- Dr Karsten Ro- Uro       Wt Readings from Last 3 Encounters:  10/10/16 183 lb (83 kg)  10/07/16 187 lb 9.6 oz (85.1 kg)  09/30/15 186 lb (84.4 kg)   BP Readings from Last 3 Encounters:  10/10/16 122/88  10/07/16 100/80  09/30/15 115/70   Pulse Readings from Last 3 Encounters:  10/10/16 88  10/07/16 86  09/30/15 (!) 56   BMI Readings from Last 3 Encounters:  10/10/16 32.42 kg/m  10/07/16 33.23 kg/m    09/30/15 32.95 kg/m    Patient Care Team    Relationship Specialty Notifications Start End  Pleas Koch, NP PCP - General Internal Medicine  09/29/15   Rayburn, Neta Mends, PA-C Physician Assistant Plastic Surgery  12/30/16   Kathie Rhodes, MD Consulting Physician Urology  12/30/16   Sherlyn Hay, DO Consulting Physician Obstetrics and Gynecology  12/30/16     Patient Active Problem List   Diagnosis Date Noted  . Obesity (BMI 30.0-34.9) 09/18/2015    Priority: High  . Changing pigmented skin lesion 06/18/2016  . h/o Kidney stones 09/18/2015     Past Medical History:  Diagnosis Date  . Heart murmur   . Seasonal allergies      Past Medical History:  Diagnosis Date  . Heart murmur   . Seasonal allergies      Past Surgical History:  Procedure Laterality Date  . CYSTOSCOPY WITH URETEROSCOPY AND STENT PLACEMENT Right 09/29/2015   Procedure: RIGHT  URETEROSCOPY AND STENT PLACEMENT, RETROGRADE;  Surgeon: Kathie Rhodes, MD;  Location: [facility];  Service: Urology;  Laterality: Right;  . HOLMIUM LASER APPLICATION Right 08/18/7865   Procedure: WITH HOLMIUM LASER;  Surgeon: Kathie Rhodes, MD;  Location: [facility];  Service: Urology;  Laterality: Right;  . right stent placed     Right Renal Stent  . TONSILLECTOMY AND ADENOIDECTOMY  1994     Family History  Problem Relation Age of Onset  . Arthritis Mother   . Hypertension Mother   . Hypertension Father   . Alcohol abuse Maternal Uncle   . Heart disease Maternal Grandfather   . Breast cancer Paternal Grandmother      History  Drug Use No     History  Alcohol Use No     History  Smoking Status  . Never Smoker  Smokeless Tobacco  . Never Used     Outpatient Encounter Prescriptions as of 12/30/2016  Medication Sig  . azithromycin (ZITHROMAX) 250 MG tablet 2 tabs by mouth on day 1, followed by 1 tab by mouth daily days 2-5  . benzonatate (TESSALON) 100 MG capsule Take 1 capsule (100 mg total) by  mouth 2 (two) times daily as needed for cough.  . chlorpheniramine-HYDROcodone (TUSSIONEX PENNKINETIC ER) 10-8 MG/5ML SUER Take 5 mLs by mouth every 12 (twelve) hours as needed.  . Prenatal Vit-Fe Fumarate-FA (MULTIVITAMIN-PRENATAL) 27-0.8 MG TABS tablet Take 1 tablet by mouth daily at 12 noon.  . Probiotic Product (PROBIOTIC ADVANCED PO) Take 1 capsule by mouth daily.  Marland Kitchen triamcinolone (KENALOG) 0.025 % cream Apply 1 application topically daily as needed (for eczema).   No facility-administered encounter medications on file as of 12/30/2016.     Allergies: Cefdinir   ROS   Objective:   There were no vitals taken for this visit. There is no height or weight on file to calculate BMI. General: Well Developed, well nourished, and  in no acute distress.  Neuro: Alert and oriented x3, extra-ocular muscles intact, sensation grossly intact.  HEENT:/AT, PERRLA, neck supple, No carotid bruits Skin: no gross rashes  Cardiac: Regular rate and rhythm Respiratory: Essentially clear to auscultation bilaterally. Not using accessory muscles, speaking in full sentences.  Abdominal: not grossly distended Musculoskeletal: Ambulates w/o diff, FROM * 4 ext.  Vasc: less 2 sec cap RF, warm and pink  Psych:  No HI/SI, judgement and insight good, Euthymic mood. Full Affect.    Recent Results (from the past 2160 hour(s))  POC Influenza A&B(BINAX/QUICKVUE)     Status: None   Collection Time: 10/07/16  5:37 PM  Result Value Ref Range   Influenza A, POC Negative Negative   Influenza B, POC Negative Negative

## 2016-12-30 NOTE — Assessment & Plan Note (Signed)

## 2017-01-03 ENCOUNTER — Other Ambulatory Visit (INDEPENDENT_AMBULATORY_CARE_PROVIDER_SITE_OTHER): Payer: BC Managed Care – PPO

## 2017-01-03 DIAGNOSIS — Z0000 Encounter for general adult medical examination without abnormal findings: Secondary | ICD-10-CM

## 2017-01-03 DIAGNOSIS — E669 Obesity, unspecified: Secondary | ICD-10-CM

## 2017-01-03 DIAGNOSIS — E66811 Obesity, class 1: Secondary | ICD-10-CM

## 2017-01-04 LAB — CBC WITH DIFFERENTIAL/PLATELET
BASOS ABS: 0 10*3/uL (ref 0.0–0.2)
Basos: 1 %
EOS (ABSOLUTE): 0.3 10*3/uL (ref 0.0–0.4)
Eos: 6 %
HEMOGLOBIN: 13.3 g/dL (ref 11.1–15.9)
Hematocrit: 39.4 % (ref 34.0–46.6)
IMMATURE GRANS (ABS): 0 10*3/uL (ref 0.0–0.1)
IMMATURE GRANULOCYTES: 0 %
LYMPHS: 40 %
Lymphocytes Absolute: 1.9 10*3/uL (ref 0.7–3.1)
MCH: 29.1 pg (ref 26.6–33.0)
MCHC: 33.8 g/dL (ref 31.5–35.7)
MCV: 86 fL (ref 79–97)
Monocytes Absolute: 0.4 10*3/uL (ref 0.1–0.9)
Monocytes: 8 %
NEUTROS ABS: 2.2 10*3/uL (ref 1.4–7.0)
NEUTROS PCT: 45 %
PLATELETS: 201 10*3/uL (ref 150–379)
RBC: 4.57 x10E6/uL (ref 3.77–5.28)
RDW: 14.8 % (ref 12.3–15.4)
WBC: 4.9 10*3/uL (ref 3.4–10.8)

## 2017-01-04 LAB — COMPREHENSIVE METABOLIC PANEL
ALBUMIN: 4.4 g/dL (ref 3.5–5.5)
ALT: 11 IU/L (ref 0–32)
AST: 14 IU/L (ref 0–40)
Albumin/Globulin Ratio: 1.7 (ref 1.2–2.2)
Alkaline Phosphatase: 70 IU/L (ref 39–117)
BUN / CREAT RATIO: 18 (ref 9–23)
BUN: 13 mg/dL (ref 6–20)
Bilirubin Total: 0.3 mg/dL (ref 0.0–1.2)
CALCIUM: 9.4 mg/dL (ref 8.7–10.2)
CO2: 23 mmol/L (ref 20–29)
CREATININE: 0.73 mg/dL (ref 0.57–1.00)
Chloride: 105 mmol/L (ref 96–106)
GFR calc non Af Amer: 105 mL/min/{1.73_m2} (ref >59)
GFR, EST AFRICAN AMERICAN: 121 mL/min/{1.73_m2} (ref >59)
GLUCOSE: 89 mg/dL (ref 65–99)
Globulin, Total: 2.6 g/dL (ref 1.5–4.5)
Potassium: 4.1 mmol/L (ref 3.5–5.2)
Sodium: 141 mmol/L (ref 134–144)
TOTAL PROTEIN: 7 g/dL (ref 6.0–8.5)

## 2017-01-04 LAB — LIPID PANEL
Chol/HDL Ratio: 3.4 ratio (ref 0.0–4.4)
Cholesterol, Total: 164 mg/dL (ref 100–199)
HDL: 48 mg/dL (ref >39)
LDL CALC: 105 mg/dL — AB (ref 0–99)
Triglycerides: 55 mg/dL (ref 0–149)
VLDL CHOLESTEROL CAL: 11 mg/dL (ref 5–40)

## 2017-01-04 LAB — TSH: TSH: 2.22 u[IU]/mL (ref 0.450–4.500)

## 2017-01-04 LAB — HEMOGLOBIN A1C
Est. average glucose Bld gHb Est-mCnc: 103 mg/dL
HEMOGLOBIN A1C: 5.2 % (ref 4.8–5.6)

## 2017-01-04 LAB — VITAMIN D 25 HYDROXY (VIT D DEFICIENCY, FRACTURES): Vit D, 25-Hydroxy: 21.2 ng/mL — ABNORMAL LOW (ref 30.0–100.0)

## 2017-01-04 IMAGING — CR DG ABDOMEN 1V
2 series · 2 of 2 positions shown · non-contrast
Comparison: 09/26/2015

CLINICAL DATA: Preop for right ureteral stone

EXAM:
ABDOMEN - 1 VIEW

[t abdomen supine (1 of 2)]
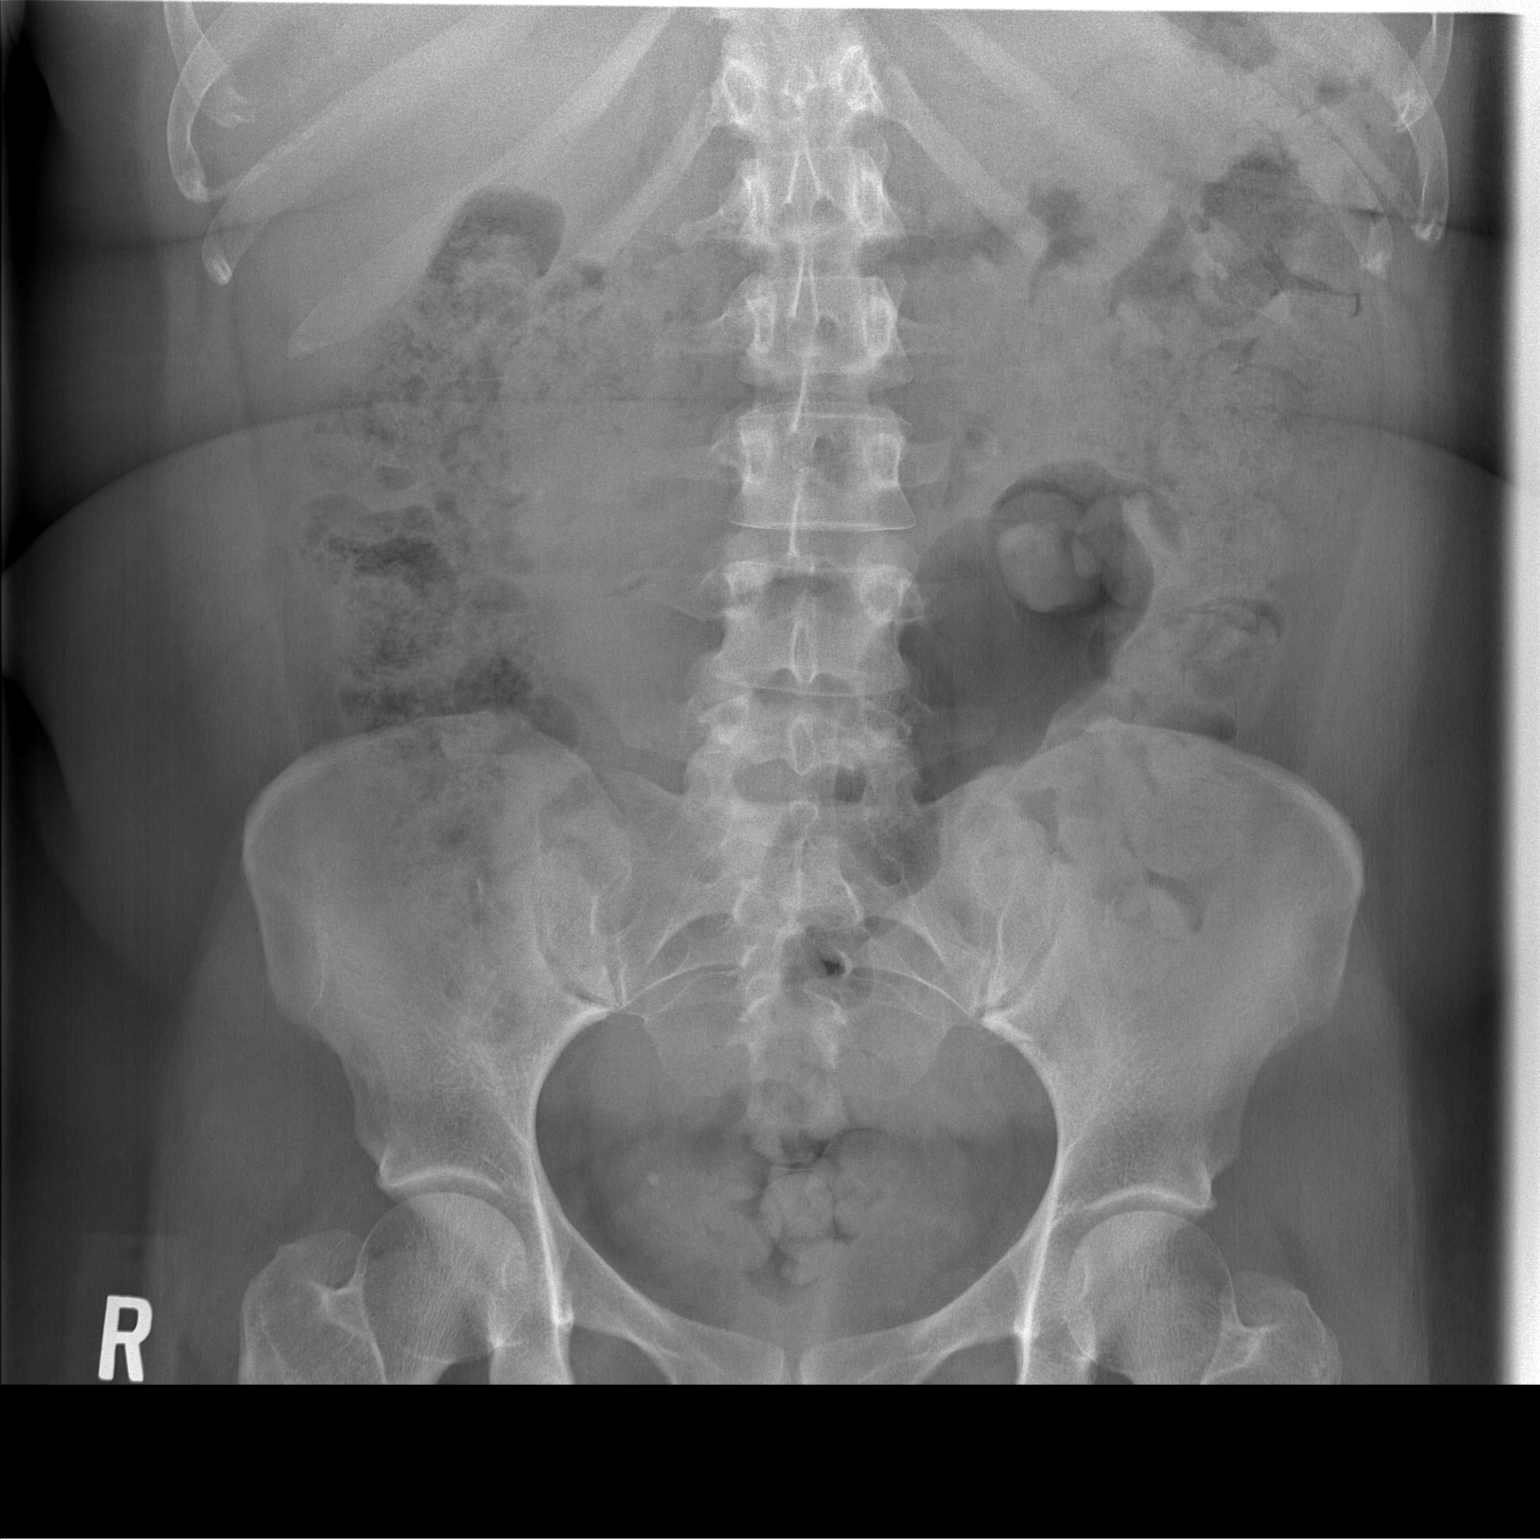

[t abdomen supine (2 of 2)]
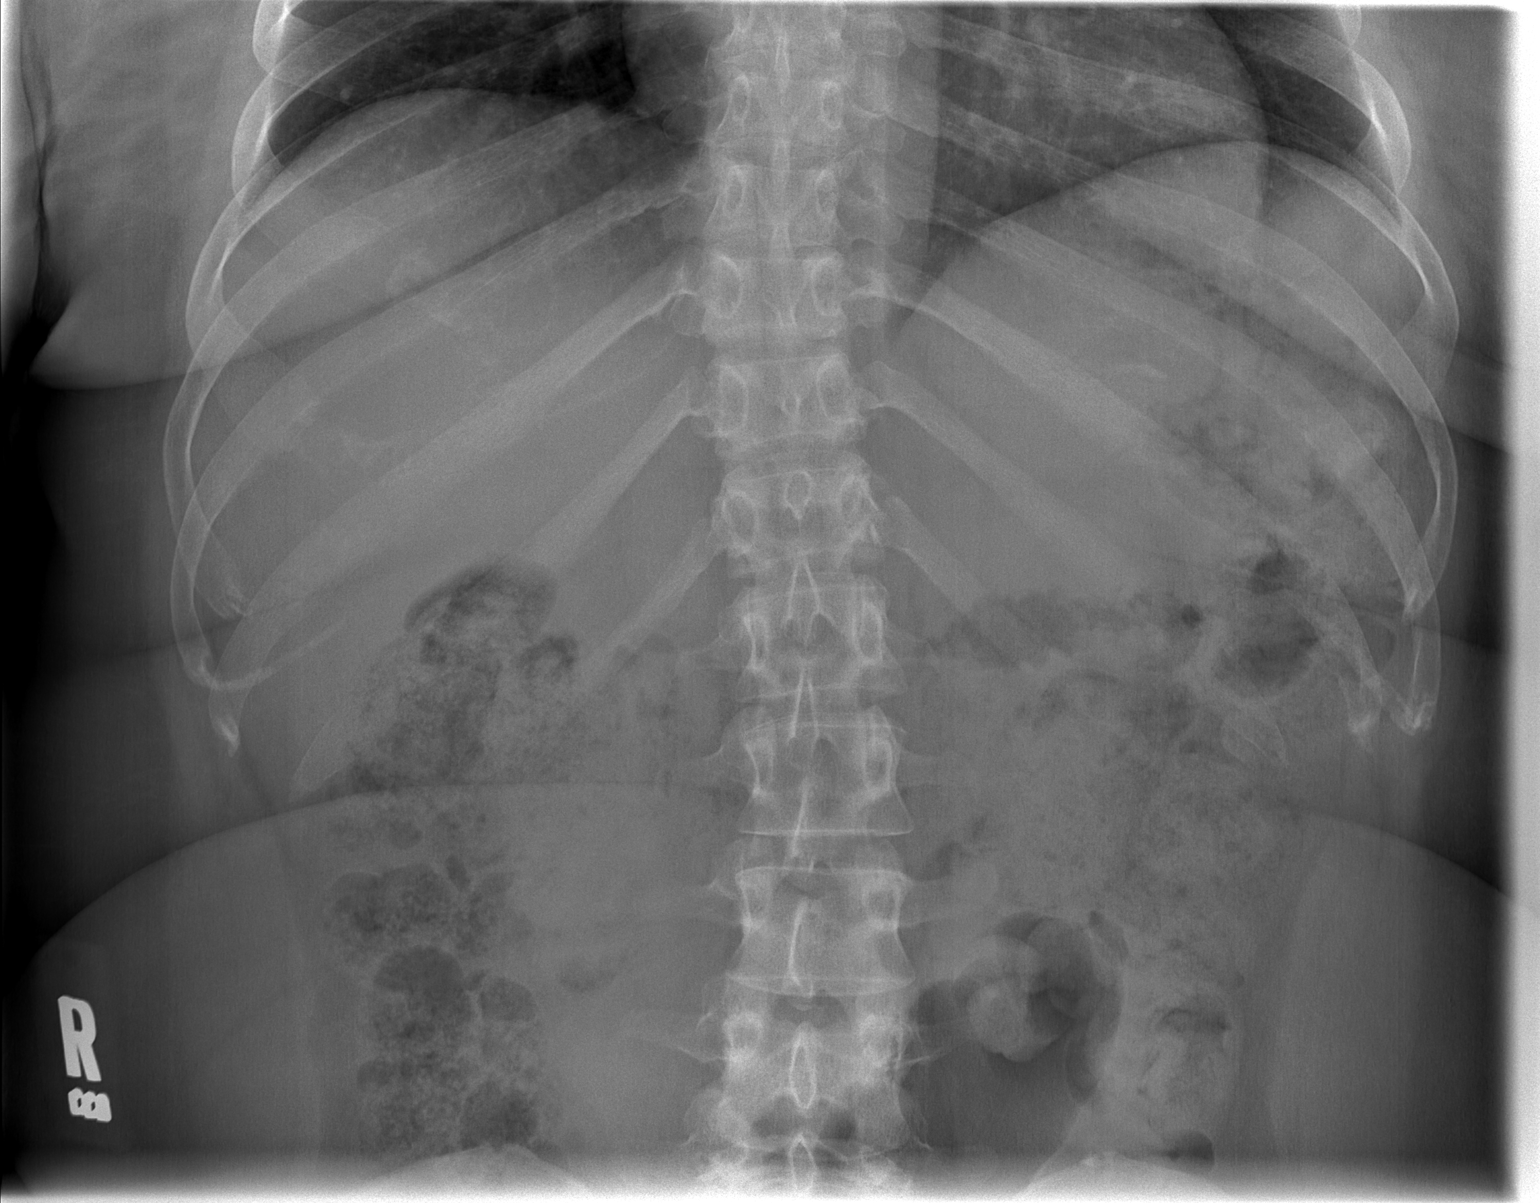

[2 of 2 positions shown; findings below may reference images not displayed]

FINDINGS: There is normal small bowel gas pattern. Study is limited by
moderate stool throughout the colon. No definite renal
calcifications are identified. Again noted 4 mm calcification in
right pelvis suspicious for distal right ureteral calculus.
IMPRESSION: No definite renal calcifications are identified. Again noted 4 mm
calcification in right pelvis suspicious for distal right ureteral
calculus.

## 2017-01-05 IMAGING — CR DG ABDOMEN 1V
1 series · 1 of 1 positions shown · non-contrast
Comparison: None.

CLINICAL DATA: Nausea and vomiting.

EXAM:
ABDOMEN - 1 VIEW

[t abdomen supine]
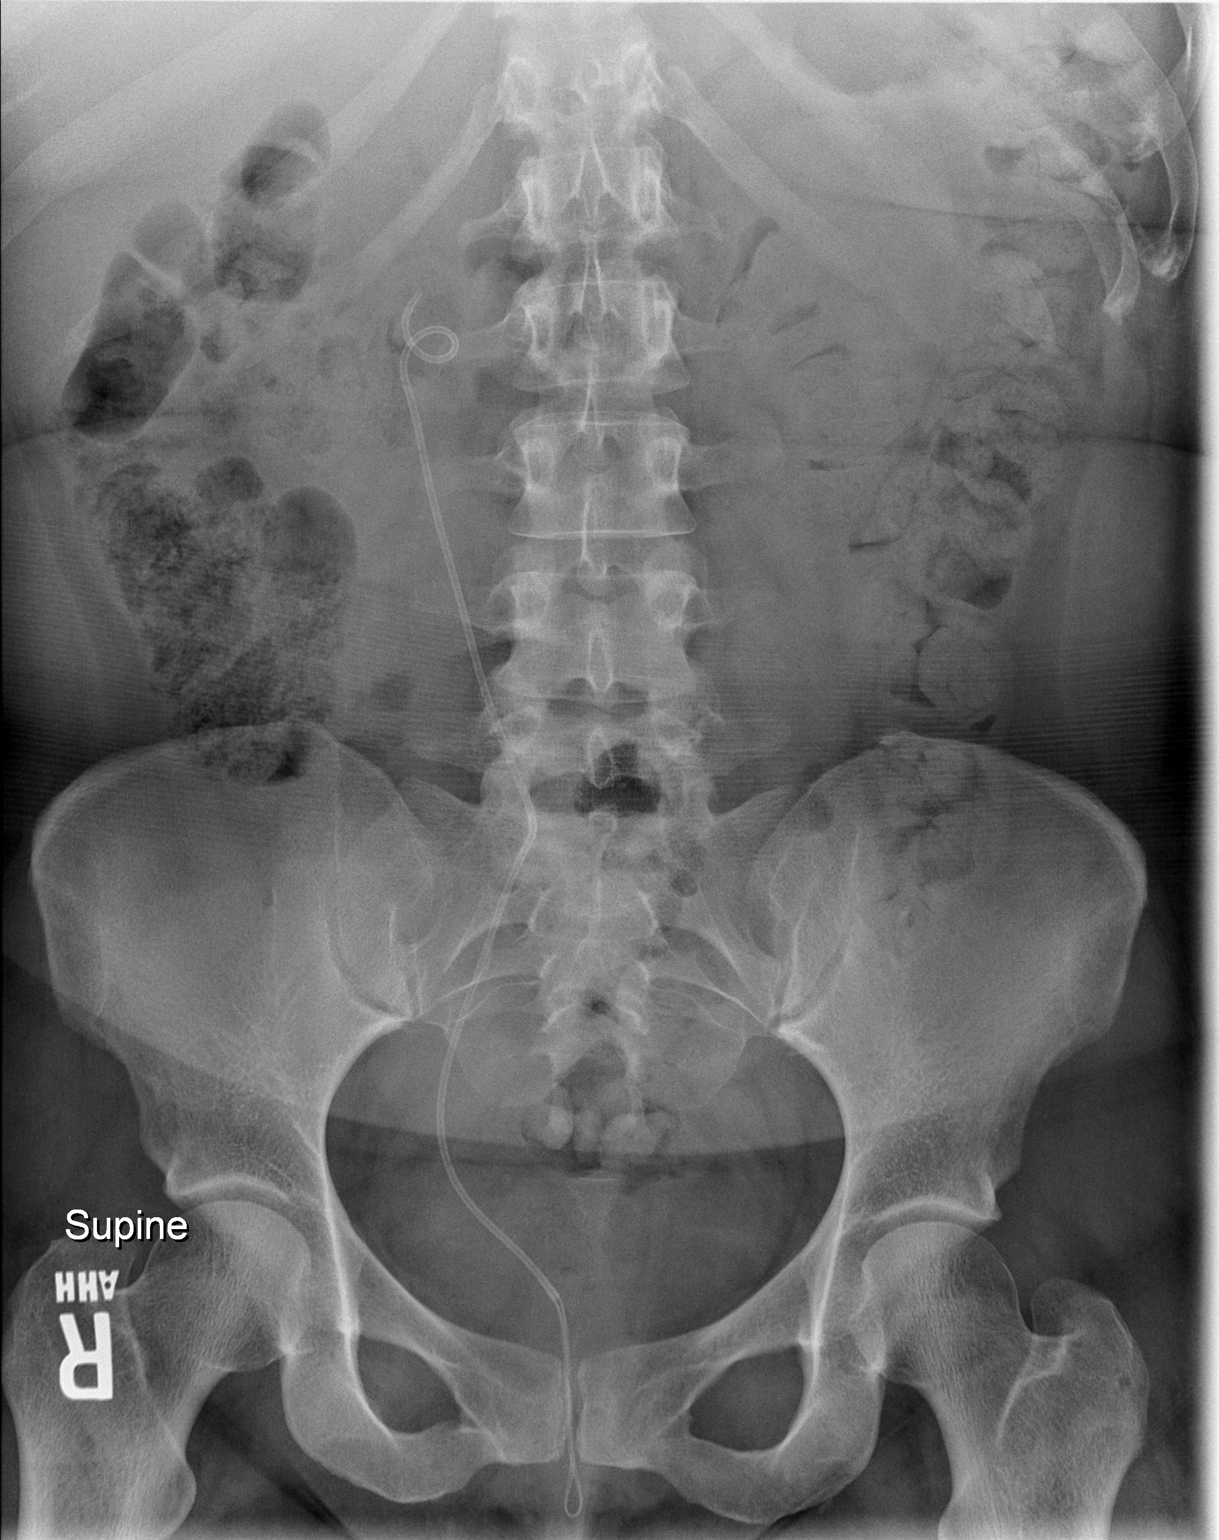

[1 of 1 positions shown; findings below may reference images not displayed]

FINDINGS: There is a right-sided ureteral stent. The superior pigtail is
likely within the right renal pelvis. The distal pigtail has not
formed. There is a wire extending out of the distal aspect of the
stent extending inferiorly at midline. Recommend clinical
correlation. No stones are seen along the course of the right
ureteral stent. The kidneys are otherwise not well assessed due to
overlying bowel contents. No convincing evidence of renal or
ureteral stones. Bones and soft tissues otherwise normal.
IMPRESSION: The distal pigtail of the right ureteral stent is not formed. A wire
appears to extend out of the distal aspect of the stent extending
inferiorly. Recommend clinical correlation.

## 2017-01-10 ENCOUNTER — Ambulatory Visit (INDEPENDENT_AMBULATORY_CARE_PROVIDER_SITE_OTHER): Payer: BC Managed Care – PPO | Admitting: Family Medicine

## 2017-01-10 ENCOUNTER — Encounter: Payer: Self-pay | Admitting: Family Medicine

## 2017-01-10 VITALS — BP 120/80 | HR 63 | Ht 63.0 in | Wt 200.0 lb

## 2017-01-10 DIAGNOSIS — F5102 Adjustment insomnia: Secondary | ICD-10-CM

## 2017-01-10 DIAGNOSIS — E559 Vitamin D deficiency, unspecified: Secondary | ICD-10-CM

## 2017-01-10 DIAGNOSIS — E669 Obesity, unspecified: Secondary | ICD-10-CM

## 2017-01-10 MED ORDER — VITAMIN D (ERGOCALCIFEROL) 1.25 MG (50000 UNIT) PO CAPS: 50000.0000 [IU] | ORAL_CAPSULE | ORAL | 10 refills | Status: DC

## 2017-01-10 MED ORDER — ZOLPIDEM TARTRATE ER 12.5 MG PO TBCR: 12.5000 mg | EXTENDED_RELEASE_TABLET | Freq: Every evening | ORAL | 0 refills | Status: DC | PRN

## 2017-01-10 MED ORDER — VITAMIN D3 125 MCG (5000 UT) PO TABS: ORAL_TABLET | ORAL | 3 refills | Status: AC

## 2017-01-10 NOTE — Patient Instructions (Addendum)
62mo- f/up for reck Vit D - please remember to take your once weekly dose as well as your daily.  Please pick up the weekly at the pharmacy  If you have insomnia or difficulty sleeping, this information is for you:  - Avoid caffeinated beverages after lunch,  no alcoholic beverages,  no eating within 2-3 hours of lying down,  avoid exposure to blue light before bed,  avoid daytime naps, and  needs to maintain a regular sleep schedule- go to sleep and wake up around the same time every night.   - Resolve concerns or worries before entering bedroom:  Discussed relaxation techniques with patient and to keep a journal to write down fears\ worries.  I suggested seeing a counselor for CBT.   - Recommend patient meditate or do deep breathing exercises to help relax.   Incorporate the use of white noise machines or listen to "sleep meditation music", or recordings of guided meditations for sleep from YouTube which are free, such as  "guided meditation for detachment from over thinking"  by Mayford Knife.

## 2017-01-10 NOTE — Progress Notes (Signed)
Impression and Recommendations:    1. Obesity (BMI 30.0-34.9)   2. Vitamin D deficiency, with symptoms   3. Adjustment insomnia     Please see orders section below for further details of actions taken during this office visit.  Gross side effects, risk and benefits, and alternatives of medications discussed with patient.  Patient is aware that all medications have potential side effects and we are unable to predict every side effect or drug-drug interaction that may occur.  Expresses verbal understanding and consents to current therapy plan and treatment regiment.  1) Anticipatory Guidance: Discussed importance of wearing a seatbelt while driving, not texting while driving; sunscreen when outside along with yearly skin surveillance; eating a well balanced and modest diet; physical activity at least 25 minutes per day or 150 min/ week of moderate to intense activity.  2) Immunizations / Screenings / Labs:  All immunizations and screenings that patient agrees to, are up-to-date per recommendations or will be updated today.  Patient understands the needs for q 71mo dental and yearly vision screens which pt will schedule independently. Obtain CBC, CMP, HgA1c, Lipid panel, TSH and vit D when fasting if not already done recently.   3) Weight:   Discussed goal of losing even 5-10% of current body weight which would improve overall feelings of well being and improve objective health data significantly.   Improve nutrient density of diet through increasing intake of fruits and vegetables and decreasing saturated/trans fats, white flour products and refined sugar products.   F-up preventative CPE in 1 year. F/up sooner for chronic care management as discussed and/or prn.   No problem-specific Assessment & Plan notes found for this encounter.    No orders of the defined types were placed in this encounter.    Meds ordered this encounter  Medications  . Vitamin D, Ergocalciferol, (DRISDOL)  50000 units CAPS capsule    Sig: Take 1 capsule (50,000 Units total) by mouth every 7 (seven) days.    Dispense:  12 capsule    Refill:  10  . Cholecalciferol (VITAMIN D3) 5000 units TABS    Sig: 5,000 IU OTC vitamin D3 daily.    Dispense:  90 tablet    Refill:  3  . zolpidem (AMBIEN CR) 12.5 MG CR tablet    Sig: Take 1 tablet (12.5 mg total) by mouth at bedtime as needed for sleep.    Dispense:  30 tablet    Refill:  0     Discontinued Medications   No medications on file      Please see orders placed and AVS handed out to patient at the end of our visit for further patient instructions/ counseling done pertaining to today's office visit.     Subjective:    Chief Complaint  Patient presents with  . Annual Exam   CC:  Patient is also here today to discuss her recent lab work that was done on 6\15\18.    HPI: Katelyn Wright is a 41 y.o. female who presents to [place] at [facility] today a yearly health maintenance exam.  Counseling- she called for appt  Will start [place] soon--> she had made up my mind.    Health Maintenance Summary Reviewed and updated, unless pt declines services.  Aspirin: administering 81 mg daily Tdap: Up to date Tobacco History Reviewed:   Y - none Alcohol:    No concerns, no excessive use Exercise Habits:   Not meeting AHA guidelines- but trying  Drug Use:   None  HAS GYN-->  Going for exam July 3rd Birth control method:   n/a Menses regular:     n/a Lumps or breast concerns:      no Breast Cancer Family History:      No Bone/ DEXA scan:       Wt Readings from Last 3 Encounters:  01/10/17 200 lb (90.7 kg)  12/30/16 196 lb (88.9 kg)  10/10/16 183 lb (83 kg)   BP Readings from Last 3 Encounters:  01/10/17 120/80  12/30/16 125/85  10/10/16 122/88   Pulse Readings from Last 3 Encounters:  01/10/17 63  12/30/16 66  10/10/16 88     Past Medical History:  Diagnosis Date  . Heart murmur   . Seasonal allergies        Past Surgical History:  Procedure Laterality Date  . CYSTOSCOPY WITH URETEROSCOPY AND STENT PLACEMENT Right 09/29/2015   Procedure: RIGHT  URETEROSCOPY AND STENT PLACEMENT, RETROGRADE;  Surgeon: Kathie Rhodes, MD;  Location: [facility];  Service: Urology;  Laterality: Right;  . HOLMIUM LASER APPLICATION Right 2/63/7858   Procedure: WITH HOLMIUM LASER;  Surgeon: Kathie Rhodes, MD;  Location: [facility];  Service: Urology;  Laterality: Right;  . right stent placed     Right Renal Stent  . TONSILLECTOMY AND ADENOIDECTOMY  1994      Family History  Problem Relation Age of Onset  . Arthritis Mother   . Hypertension Mother   . Hypertension Father   . Alcohol abuse Maternal Uncle   . Heart disease Maternal Grandfather   . Breast cancer Paternal Grandmother       History  Drug Use No  ,   History  Alcohol Use No  ,   History  Smoking Status  . Never Smoker  Smokeless Tobacco  . Never Used  ,   History  Sexual Activity  . Sexual activity: Yes  . Birth control/ protection: None    Current Outpatient Prescriptions on File Prior to Visit  Medication Sig Dispense Refill  . Prenatal Vit-Fe Fumarate-FA (MULTIVITAMIN-PRENATAL) 27-0.8 MG TABS tablet Take 1 tablet by mouth daily at 12 noon.    . Probiotic Product (PROBIOTIC ADVANCED PO) Take 1 capsule by mouth daily.    Marland Kitchen triamcinolone (KENALOG) 0.025 % cream Apply 1 application topically daily as needed (for eczema).     No current facility-administered medications on file prior to visit.     Allergies: Cefdinir  Review of Systems: General:   Denies fever, chills, unexplained weight loss.  Optho/Auditory:   Denies visual changes, blurred vision/LOV Respiratory:   Denies SOB, DOE more than baseline levels.  Cardiovascular:   Denies chest pain, palpitations, new onset peripheral edema  Gastrointestinal:   Denies nausea, vomiting, diarrhea.  Genitourinary: Denies dysuria, freq/ urgency, flank pain or discharge from  genitals.  Endocrine:     Denies hot or cold intolerance, polyuria, polydipsia. Musculoskeletal:   Denies unexplained myalgias, joint swelling, unexplained arthralgias, gait problems.  Skin:  Denies rash, suspicious lesions Neurological:     Denies dizziness, unexplained weakness, numbness  Psychiatric/Behavioral:   Denies mood changes, suicidal or homicidal ideations, hallucinations    Objective:    Blood pressure 120/80, pulse 63, height 5\' 3"  (1.6 m), weight 200 lb (90.7 kg). Body mass index is 35.43 kg/m. General Appearance:    Alert, cooperative, no distress, appears stated age  Head:    Normocephalic, without obvious abnormality, atraumatic  Eyes:    PERRL, conjunctiva/corneas clear, EOM's  intact, fundi    benign, both eyes  Ears:    Normal TM's and external ear canals, both ears  Nose:   Nares normal, septum midline, mucosa normal, no drainage    or sinus tenderness  Throat:   Lips w/o lesion, mucosa moist, and tongue normal; teeth and   gums normal  Neck:   Supple, symmetrical, trachea midline, no adenopathy;    thyroid:  no enlargement/tenderness/nodules; no carotid   bruit or JVD  Back:     Symmetric, no curvature, ROM normal, no CVA tenderness  Lungs:     Clear to auscultation bilaterally, respirations unlabored, no       Wh/ R/ R  Chest Wall:    No tenderness or gross deformity; normal excursion   Heart:    Regular rate and rhythm, S1 and S2 normal, no murmur, rub   or gallop  Breast Exam:    No tenderness, masses, or nipple abnormality b/l; no d/c  Abdomen:     Soft, non-tender, bowel sounds active all four quadrants, NO   G/R/R, no masses, no organomegaly  Genitalia:    Ext genitalia: without lesion, no rash or discharge, No         tenderness;  Cervix: WNL's w/o discharge or lesion;        Adnexa:  No tenderness or palpable masses   Rectal:    Normal tone, no masses or tenderness;   guaiac negative stool  Extremities:   Extremities normal, atraumatic, no cyanosis or  gross edema  Pulses:   2+ and symmetric all extremities  Skin:   Warm, dry, Skin color, texture, turgor normal, no obvious rashes or lesions Psych: No HI/SI, judgement and insight good, Euthymic mood. Full Affect.  Neurologic:   CNII-XII intact, normal strength, sensation and reflexes    Throughout   DEXA:    [email or web address]   Based on the [place] FRAX tool, a 39 year old white woman with no other risk factors has a 9.3% 10-year risk for any osteoporotic fracture. White women between the ages of 35 and 53 years with equivalent or greater 10-year fracture risks based on specific risk factors include but are not limited to the following persons: 79) a 39 year old current smoker with a BMI less than 21 kg/m2, daily alcohol use, and parental fracture history; 35) a 39 year old woman with a parental fracture history; 53) a 39 year old woman with a BMI less than 21 kg/m2 and daily alcohol use; and 28) a 39 year old current smoker with daily alcohol use.   The FRAX tool also predicts 10-year fracture risks for black, Asian, and Hispanic women in the [place]. In general, estimated fracture risks in nonwhite women are lower than those for white women of the same age. Although the USPSTF recommends using a 9.3% 10-year fracture risk threshold to screen women aged 31 to 50 years,

## 2017-01-24 ENCOUNTER — Other Ambulatory Visit: Payer: Self-pay | Admitting: Obstetrics and Gynecology

## 2017-01-24 DIAGNOSIS — N6009 Solitary cyst of unspecified breast: Secondary | ICD-10-CM

## 2017-01-31 ENCOUNTER — Ambulatory Visit
Admission: RE | Admit: 2017-01-31 | Discharge: 2017-01-31 | Disposition: A | Discharge status: Home / Self Care | Payer: BC Managed Care – PPO | Source: Ambulatory Visit | Attending: Obstetrics and Gynecology | Admitting: Obstetrics and Gynecology

## 2017-01-31 ENCOUNTER — Other Ambulatory Visit: Payer: BC Managed Care – PPO

## 2017-01-31 DIAGNOSIS — N6009 Solitary cyst of unspecified breast: Secondary | ICD-10-CM

## 2017-02-07 ENCOUNTER — Ambulatory Visit: Payer: BC Managed Care – PPO | Admitting: Family Medicine

## 2017-02-14 ENCOUNTER — Ambulatory Visit (INDEPENDENT_AMBULATORY_CARE_PROVIDER_SITE_OTHER): Payer: BC Managed Care – PPO | Admitting: Family Medicine

## 2017-02-14 ENCOUNTER — Encounter: Payer: Self-pay | Admitting: Family Medicine

## 2017-02-14 VITALS — BP 118/84 | HR 63 | Ht 63.0 in | Wt 188.0 lb

## 2017-02-14 DIAGNOSIS — J069 Acute upper respiratory infection, unspecified: Secondary | ICD-10-CM | POA: Diagnosis not present

## 2017-02-14 DIAGNOSIS — L259 Unspecified contact dermatitis, unspecified cause: Secondary | ICD-10-CM | POA: Diagnosis not present

## 2017-02-14 DIAGNOSIS — E559 Vitamin D deficiency, unspecified: Secondary | ICD-10-CM | POA: Diagnosis not present

## 2017-02-14 DIAGNOSIS — E669 Obesity, unspecified: Secondary | ICD-10-CM

## 2017-02-14 DIAGNOSIS — G4700 Insomnia, unspecified: Secondary | ICD-10-CM | POA: Insufficient documentation

## 2017-02-14 DIAGNOSIS — R05 Cough: Secondary | ICD-10-CM

## 2017-02-14 DIAGNOSIS — J329 Chronic sinusitis, unspecified: Secondary | ICD-10-CM | POA: Diagnosis not present

## 2017-02-14 DIAGNOSIS — L309 Dermatitis, unspecified: Secondary | ICD-10-CM

## 2017-02-14 DIAGNOSIS — R059 Cough, unspecified: Secondary | ICD-10-CM

## 2017-02-14 MED ORDER — HYDROCOD POLST-CPM POLST ER 10-8 MG/5ML PO SUER: 5.0000 mL | Freq: Two times a day (BID) | ORAL | 0 refills | Status: DC | PRN

## 2017-02-14 MED ORDER — PREDNISONE 20 MG PO TABS: ORAL_TABLET | ORAL | 0 refills | Status: DC

## 2017-02-14 NOTE — Progress Notes (Signed)
Pt here for an acute care OV today   Impression and Recommendations:    1. Upper respiratory tract infection, unspecified type   2. Rhinosinusitis   3. Cough   4. Contact dermatitis, unspecified contact dermatitis type, unspecified trigger   5. Eczema, unspecified type   6. Insomnia, unspecified type   7. Vitamin D deficiency, with symptoms   8. Obesity (BMI 30.0-34.9)    -  For patient's rhinosinusitis and likely right-sided eustachian tube dysfunction, we'll prescribe prednisone Dosepak.  Patient understands this will also help with her eczema which has been flared up recently as well.  We discussed risks and benefits and patient continues to desire treatment plan. - We'll give Tussionex that she can use when necessary cough in case she needs it.  - Continue Ambien after discussion of risks and benefits of medicines.  She will try her best to use conservative methods of sleep meditation and relaxation techniques as first line. - Patient states she feels much better on the vitamin D supplements.  Feels her energy level is better and she feels less achy all over.  This is helped her motivated to Be more active.   -  Patient has lost 12 pounds since less than 4 weeks ago.  She is been going to the gym and eating better as per our last discussion.  She is very proud of herself and I encouraged her to continue these healthier habits.   The patient was counseled, risk factors were discussed, anticipatory guidance given.  New Prescriptions   CHLORPHENIRAMINE-HYDROCODONE (TUSSIONEX) 10-8 MG/5ML SUER    Take 5 mLs by mouth every 12 (twelve) hours as needed for cough (cough, will cause drowsiness.).   PREDNISONE (DELTASONE) 20 MG TABLET    Take 3 pills a day for 2 days, 2 pills a day for 2 days, 1 pill a day for 2 days then one half pill a day for 2 days then off    Discontinued Medications   No medications on file    Gross side effects, risk and benefits, and alternatives of  medications and treatment plan in general discussed with patient.  Patient is aware that all medications have potential side effects and we are unable to predict every side effect or drug-drug interaction that may occur.   Patient will call with any questions prior to using medication if they have concerns.  Expresses verbal understanding and consents to current therapy and treatment regimen.  No barriers to understanding were identified.  Red flag symptoms and signs discussed in detail.  Patient expressed understanding regarding what to do in case of emergency\urgent symptoms  Please see AVS handed out to patient at the end of our visit for further patient instructions/ counseling done pertaining to today's office visit.   Return if symptoms worsen or fail to improve.  Also told patient to follow-up for routine care in addition to acute issues     Note: This document was prepared using Dragon voice recognition software and may include unintentional dictation errors.  Mellody Dance 2:28 PM --------------------------------------------------------------------------------------------------------------------------------------------------------------------------------------------------------------------------------------------    Subjective:    CC:  Chief Complaint  Patient presents with  . Follow-up    HPI: Katelyn Wright is a 41 y.o. female who presents to [place] [facility] today for issues as discussed below.  Patient is here for follow-up today.  Sleep: She is been using one half of Ambien approximate 1 day per week.  She is doing sleep meditation as  we discussed in the past.  This is working well for her and she denies any side effects.  Declines med refill  Also has acute complaints of URI symptoms today.  Her child was sick last week.  She has had upper respiratory symptoms for 3-4 days.  She also has had some sweating and felt warm although did not check for  fevers.  She has sinus congestion, runny nose  and pain on the the right side of her face and into her right ear.  She has taken some NyQuil which has helped.  However, she is no better or may be possibly a little worse.    Going on vacation next Wednesday and would like to be aggressive in helping her resolve her symptoms.    Wt Readings from Last 3 Encounters:  02/14/17 188 lb (85.3 kg)  01/10/17 200 lb (90.7 kg)  12/30/16 196 lb (88.9 kg)   BP Readings from Last 3 Encounters:  02/14/17 118/84  01/10/17 120/80  12/30/16 125/85   BMI Readings from Last 3 Encounters:  02/14/17 33.30 kg/m  01/10/17 35.43 kg/m  12/30/16 34.72 kg/m     Patient Care Team    Relationship Specialty Notifications Start End  Mellody Dance, DO PCP - General Family Medicine  12/30/16   Rayburn, Neta Mends, PA-C Physician Assistant Plastic Surgery  12/30/16   Kathie Rhodes, MD Consulting Physician Urology  12/30/16   Sherlyn Hay, Taylor Physician Obstetrics and Gynecology  12/30/16      Patient Active Problem List   Diagnosis Date Noted  . Obesity (BMI 30.0-34.9) 09/18/2015    Priority: High  . Insomnia 02/14/2017    Priority: Medium  . Vitamin D deficiency, with symptoms 01/10/2017    Priority: Medium  . Eczema-  all over-  12/30/2016    Priority: Medium  . 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    Priority: Low  . 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 h/o Kidney stones 09/18/2015    Past Medical history, Surgical history, Family history, Social history, Allergies and Medications have been entered into the medical record, reviewed and changed as needed.    Current Meds  Medication Sig  . Cholecalciferol (VITAMIN D3) 5000 units TABS 5,000 IU OTC vitamin D3 daily.  . Prenatal Vit-Fe Fumarate-FA (MULTIVITAMIN-PRENATAL) 27-0.8 MG TABS tablet Take 1 tablet by mouth daily at 12 noon.  . Probiotic Product (PROBIOTIC ADVANCED PO) Take 1 capsule by mouth daily.  Marland Kitchen  triamcinolone (KENALOG) 0.025 % cream Apply 1 application topically daily as needed (for eczema).  . Vitamin D, Ergocalciferol, (DRISDOL) 50000 units CAPS capsule Take 1 capsule (50,000 Units total) by mouth every 7 (seven) days.  Marland Kitchen zolpidem (AMBIEN CR) 12.5 MG CR tablet Take 1 tablet (12.5 mg total) by mouth at bedtime as needed for sleep.    Allergies:  Allergies  Allergen Reactions  . Cefdinir Rash     Review of Systems: General:   Denies fever, chills, unexplained weight loss.  Optho/Auditory:   Denies visual changes, blurred vision/LOV Respiratory:   Denies wheeze, DOE more than baseline levels.  Cardiovascular:   Denies chest pain, palpitations, new onset peripheral edema  Gastrointestinal:   Denies nausea, vomiting, diarrhea, abd pain.  Genitourinary: Denies dysuria, freq/ urgency, flank pain or discharge from genitals.  Endocrine:     Denies hot or cold intolerance, polyuria, polydipsia. Musculoskeletal:   Denies unexplained myalgias, joint swelling, unexplained arthralgias, gait problems.  Skin:  Denies new onset rash, suspicious lesions Neurological:  Denies dizziness, unexplained weakness, numbness  Psychiatric/Behavioral:   Denies mood changes, suicidal or homicidal ideations, hallucinations    Objective:   Blood pressure 118/84, pulse 63, height 5\' 3"  (1.6 m), weight 188 lb (85.3 kg), last menstrual period 02/05/2017. Body mass index is 33.3 kg/m. General:  Well Developed, well nourished, appropriate for stated age.  Neuro:  Alert and oriented,  extra-ocular muscles intact  HEENT:  Normocephalic, atraumatic, neck supple, TM on the right slightly protruded, no air-fluid levels or erythema, left within normal limits.  Oropharynx mild erythema due to postnasal drip with stranding, nasal concha erythematous and swollen.  Clear discharge.  No lymphadenopathy.  No tenderness to percussion Sinuses  Skin:  no gross rash, warm, pink. Cardiac:  RRR, S1 S2 Respiratory:  ECTA  B/L and A/P, Not using accessory muscles, speaking in full sentences- unlabored. Vascular:  Ext warm, no cyanosis apprec.; cap RF less 2 sec. Psych:  No HI/SI, judgement and insight good, Euthymic mood. Full Affect.

## 2017-02-14 NOTE — Patient Instructions (Addendum)
Let me know if your URI symptoms do not continue to improve on her current treatment regimen.  If it continues and is not improves Monday-3 days from now, call and we will give you Augmentin antibiotic prescription.     Contact Dermatitis Dermatitis is redness, soreness, and swelling (inflammation) of the skin. Contact dermatitis is a reaction to certain substances that touch the skin. There are two types of contact dermatitis:  Irritant contact dermatitis. This type is caused by something that irritates your skin, such as dry hands from washing them too much. This type does not require previous exposure to the substance for a reaction to occur. This type is more common.  Allergic contact dermatitis. This type is caused by a substance that you are allergic to, such as a nickel allergy or poison ivy. This type only occurs if you have been exposed to the substance (allergen) before. Upon a repeat exposure, your body reacts to the substance. This type is less common.  What are the causes? Many different substances can cause contact dermatitis. Irritant contact dermatitis is most commonly caused by exposure to:  Makeup.  Soaps.  Detergents.  Bleaches.  Acids.  Metal salts, such as nickel.  Allergic contact dermatitis is most commonly caused by exposure to:  Poisonous plants.  Chemicals.  Jewelry.  Latex.  Medicines.  Preservatives in products, such as clothing.  What increases the risk? This condition is more likely to develop in:  People who have jobs that expose them to irritants or allergens.  People who have certain medical conditions, such as asthma or eczema.  What are the signs or symptoms? Symptoms of this condition may occur anywhere on your body where the irritant has touched you or is touched by you. Symptoms include:  Dryness or flaking.  Redness.  Cracks.  Itching.  Pain or a burning feeling.  Blisters.  Drainage of small amounts of blood or clear  fluid from skin cracks.  With allergic contact dermatitis, there may also be swelling in areas such as the eyelids, mouth, or genitals. How is this diagnosed? This condition is diagnosed with a medical history and physical exam. A patch skin test may be performed to help determine the cause. If the condition is related to your job, you may need to see an occupational medicine specialist. How is this treated? Treatment for this condition includes figuring out what caused the reaction and protecting your skin from further contact. Treatment may also include:  Steroid creams or ointments. Oral steroid medicines may be needed in more severe cases.  Antibiotics or antibacterial ointments, if a skin infection is present.  Antihistamine lotion or an antihistamine taken by mouth to ease itching.  A bandage (dressing).  Follow these instructions at home: [place] your skin as needed.  Apply cool compresses to the affected areas.  Try taking a bath with: ? Epsom salts. Follow the instructions on the packaging. You can get these at your local pharmacy or grocery store. ? Baking soda. Pour a small amount into the bath as directed by your health care provider. ? Colloidal oatmeal. Follow the instructions on the packaging. You can get this at your local pharmacy or grocery store.  Try applying baking soda paste to your skin. Stir water into baking soda until it reaches a paste-like consistency.  Do not scratch your skin.  Bathe less frequently, such as every other day.  Bathe in lukewarm water. Avoid using hot water. Medicines  Take or apply  over-the-counter and prescription medicines only as told by your health care provider.  If you were prescribed an antibiotic medicine, take or apply your antibiotic as told by your health care provider. Do not stop using the antibiotic even if your condition starts to improve. General instructions  Keep all follow-up visits as told by  your health care provider. This is important.  Avoid the substance that caused your reaction. If you do not know what caused it, keep a journal to try to track what caused it. Write down: ? What you eat. ? What cosmetic products you use. ? What you drink. ? What you wear in the affected area. This includes jewelry.  If you were given a dressing, take care of it as told by your health care provider. This includes when to change and remove it. Contact a health care provider if:  Your condition does not improve with treatment.  Your condition gets worse.  You have signs of infection such as swelling, tenderness, redness, soreness, or warmth in the affected area.  You have a fever.  You have new symptoms. Get help right away if:  You have a severe headache, neck pain, or neck stiffness.  You vomit.  You feel very sleepy.  You notice red streaks coming from the affected area.  Your bone or joint underneath the affected area becomes painful after the skin has healed.  The affected area turns darker.  You have difficulty breathing. This information is not intended to replace advice given to you by your health care provider. Make sure you discuss any questions you have with your health care provider. Document Released: 07/05/2000 Document Revised: 12/14/2015 Document Reviewed: 11/23/2014 Elsevier Interactive Patient Education  2018 [employer].

## 2017-02-25 ENCOUNTER — Encounter: Payer: Self-pay | Admitting: Family Medicine

## 2017-02-26 ENCOUNTER — Telehealth: Payer: Self-pay | Admitting: Family Medicine

## 2017-02-26 NOTE — Telephone Encounter (Signed)
Pt called states was in to see provider 7/27  mentioned sinus issues and was placed on steroids--- Pt states still having sinus conditions and says provider said if no change in condition to call back she would call in an Antibiotic for her. --glh

## 2017-02-26 NOTE — Telephone Encounter (Signed)
Please advise. Thank you MPulliam, CMA/RT(R)  

## 2017-02-26 NOTE — Telephone Encounter (Signed)
As long as she is not allergic out advised amoxicillin 875 twice a day for 10 days.  Please make sure she is using nasal saline rinses such as meal med sinus rinse twice daily as well as using her nasal spray such as Flonase or Nasonex over-the-counter

## 2017-02-27 ENCOUNTER — Other Ambulatory Visit: Payer: Self-pay

## 2017-02-27 DIAGNOSIS — J0110 Acute frontal sinusitis, unspecified: Secondary | ICD-10-CM

## 2017-02-27 MED ORDER — AMOXICILLIN 875 MG PO TABS: 875.0000 mg | ORAL_TABLET | Freq: Two times a day (BID) | ORAL | 0 refills | Status: AC

## 2017-02-27 NOTE — Telephone Encounter (Signed)
Patient called and stated that she is still having sinus symptoms since her last office visit.  Per Dr Raliegh Scarlet sent in Amoxicillin to [facility].  Spoke to patient and notified, also verified with the patient that she has taken Amoxicillin in the past with do reactions.  MPulliam, CMA/RT(R)

## 2017-02-27 NOTE — Telephone Encounter (Signed)
Sent medication in and spoke to patient - verified that she has taken Amoxicillin in the past with no reaction.  MPulliam, CMA/RT(R)

## 2017-04-28 ENCOUNTER — Other Ambulatory Visit: Payer: Self-pay | Admitting: Obstetrics and Gynecology

## 2017-04-28 ENCOUNTER — Ambulatory Visit
Admission: RE | Admit: 2017-04-28 | Discharge: 2017-04-28 | Disposition: A | Discharge status: Home / Self Care | Payer: BC Managed Care – PPO | Source: Ambulatory Visit | Attending: Obstetrics and Gynecology | Admitting: Obstetrics and Gynecology

## 2017-04-28 DIAGNOSIS — N6310 Unspecified lump in the right breast, unspecified quadrant: Secondary | ICD-10-CM

## 2017-04-29 ENCOUNTER — Ambulatory Visit: Payer: BC Managed Care – PPO | Admitting: Family Medicine

## 2017-05-13 ENCOUNTER — Ambulatory Visit
Admission: RE | Admit: 2017-05-13 | Discharge: 2017-05-13 | Disposition: A | Discharge status: Home / Self Care | Payer: BC Managed Care – PPO | Source: Ambulatory Visit | Attending: Obstetrics and Gynecology | Admitting: Obstetrics and Gynecology

## 2017-05-13 ENCOUNTER — Other Ambulatory Visit: Payer: Self-pay | Admitting: Obstetrics and Gynecology

## 2017-05-13 DIAGNOSIS — N6310 Unspecified lump in the right breast, unspecified quadrant: Secondary | ICD-10-CM

## 2017-05-29 ENCOUNTER — Other Ambulatory Visit: Payer: BC Managed Care – PPO

## 2017-07-08 ENCOUNTER — Ambulatory Visit
Admission: RE | Admit: 2017-07-08 | Discharge: 2017-07-08 | Disposition: A | Discharge status: Home / Self Care | Payer: BC Managed Care – PPO | Source: Ambulatory Visit | Attending: Obstetrics and Gynecology | Admitting: Obstetrics and Gynecology

## 2017-07-08 DIAGNOSIS — N6310 Unspecified lump in the right breast, unspecified quadrant: Secondary | ICD-10-CM

## 2017-10-09 ENCOUNTER — Encounter: Payer: Self-pay | Admitting: Allergy

## 2017-10-09 ENCOUNTER — Ambulatory Visit: Payer: BC Managed Care – PPO | Admitting: Allergy

## 2017-10-09 VITALS — BP 122/72 | HR 76 | Temp 98.5°F | Resp 18 | Ht 63.5 in | Wt 198.8 lb

## 2017-10-09 DIAGNOSIS — J3089 Other allergic rhinitis: Secondary | ICD-10-CM

## 2017-10-09 DIAGNOSIS — L2089 Other atopic dermatitis: Secondary | ICD-10-CM | POA: Diagnosis not present

## 2017-10-09 MED ORDER — CLOBETASOL PROPIONATE 0.05 % EX OINT: 1.0000 "application " | TOPICAL_OINTMENT | Freq: Every day | CUTANEOUS | 5 refills | Status: DC

## 2017-10-09 MED ORDER — CRISABOROLE 2 % EX OINT: 1.0000 "application " | TOPICAL_OINTMENT | Freq: Two times a day (BID) | CUTANEOUS | 5 refills | Status: DC

## 2017-10-09 NOTE — Progress Notes (Signed)
New Patient Note  RE: Katelyn Wright MRN: [id number] DOB: Feb 03, 1976 Date of Office Visit: 10/09/2017  Referring provider: Mellody Dance, DO Primary care provider: Mellody Dance, DO  Chief Complaint: eczema  History of present illness: Katelyn Wright is a 42 y.o. female presenting today for consultation for eczema.  She has had eczema "all her life".  She states the topical medications she was taking is not as effective as it used to be.   She was using triamcinolone.  She has tried elocon which she states was more effective than triamcinolone but did not want to use it often due to concern for skin thinning.  She also believes she has tried protopic as well but it was too costly.  She thinks she has allergies that may be triggering her eczema.  She is concerned about dairy, eggs, additives in seasonings that may be worsening eczema.  She reports her problem areas are face, arms, back and chest.    She reports having seasonal allergies where she is not symptomatic every year.  When she is she takes Loratadine which helps with her symptoms.  Spring is typically worse time of year for her.    No history of asthma.    She states her mother told her citrus would break her out as a child.  She does state if she handles a lot of citrus products she will develop rash on hands.    Review of systems: Review of Systems  Constitutional: Negative for chills, fever and malaise/fatigue.  HENT: Negative for congestion, ear discharge, ear pain, nosebleeds, sinus pain and sore throat.   Eyes: Negative for pain, discharge and redness.  Respiratory: Negative for cough, shortness of breath and wheezing.   Cardiovascular: Negative for chest pain.  Gastrointestinal: Negative for abdominal pain, constipation, diarrhea, heartburn, nausea and vomiting.  Musculoskeletal: Negative for joint pain.  Skin: Positive for itching and rash.  Neurological: Negative for headaches.   Past medical history: Past  Medical History:  Diagnosis Date  . Eczema   . Heart murmur   . Seasonal allergies     Past surgical history: Past Surgical History:  Procedure Laterality Date  . ADENOIDECTOMY    . CYSTOSCOPY WITH URETEROSCOPY AND STENT PLACEMENT Right 09/29/2015   Procedure: RIGHT  URETEROSCOPY AND STENT PLACEMENT, RETROGRADE;  Surgeon: Kathie Rhodes, MD;  Location: [facility];  Service: Urology;  Laterality: Right;  . HOLMIUM LASER APPLICATION Right 7/32/2025   Procedure: WITH HOLMIUM LASER;  Surgeon: Kathie Rhodes, MD;  Location: [facility];  Service: Urology;  Laterality: Right;  . right stent placed     Right Renal Stent  . TONSILLECTOMY AND ADENOIDECTOMY  1994    Family history:  Family History  Problem Relation Age of Onset  . Arthritis Mother   . Hypertension Mother   . Diabetes Mother   . Hypertension Father   . Alcohol abuse Maternal Uncle   . Heart disease Maternal Grandfather   . Breast cancer Paternal Grandmother   mat uncle, cousin (died from asthma complications), nephew and niece with asthma  Social history:  Social History Narrative   Married.  Lives in home with carpeting with gas heating and central cooling, dog in the home.  No concern for water damage, mildew or roaches in the home.  No smoking history   1 daughter.    She is a Glass blower/designer   Enjoys spending time with her family, working out.     Medication List: Allergies as of 10/09/2017  Reactions   Cefdinir Rash      Medication List        Accurate as of 10/09/17  6:05 PM. Always use your most recent med list.          multivitamin-prenatal 27-0.8 MG Tabs tablet Take 1 tablet by mouth daily at 12 noon.   PROBIOTIC ADVANCED PO Take 1 capsule by mouth daily.   triamcinolone 0.025 % cream Commonly known as:  KENALOG Apply 1 application topically daily as needed (for eczema).   Vitamin D (Ergocalciferol) 50000 units Caps capsule Commonly known as:  DRISDOL Take 1 capsule (50,000 Units total) by mouth every 7  (seven) days.   Vitamin D3 5000 units Tabs 5,000 IU OTC vitamin D3 daily.   XIIDRA 5 % Soln Generic drug:  [address]itegrast PLACE 1 DROP INTO BOTH EYES TWICE A DAY       Known medication allergies: Allergies  Allergen Reactions  . Cefdinir Rash     Physical examination: Blood pressure 122/72, pulse 76, temperature 98.5 F (36.9 C), resp. rate 18, height 5' 3.5" (1.613 m), weight 198 lb 12.8 oz (90.2 kg), SpO2 98 %.  General: Alert, interactive, in no acute distress. HEENT: PERRLA, TMs pearly gray, turbinates minimally edematous without discharge, post-pharynx non erythematous. Neck: Supple without lymphadenopathy. Lungs: Clear to auscultation without wheezing, rhonchi or rales. {no increased work of breathing. CV: Normal S1, S2 without murmurs. Abdomen: Nondistended, nontender. Skin: Dry, mildly hyperpigmented, mildly thickened patches on the b/l forearms, chest, back and around the mouth. Extremities:  No clubbing, cyanosis or edema. Neuro:   Grossly intact.  Diagnositics/Labs:  Allergy testing: environmental allergy skin prick testing is positive to weeds, tree pollen, dust mite, cat.  Intradermal testing is positive to dog and mold mix 2. 10 common food allergy testing is negative.   Allergy testing results were read and interpreted by provider, documented by clinical staff.   Assessment and plan:   Atopic dermatitis   - Environmental is positive to trees, weeds, dust mites, cat dog and molds.  Avoidance measures provided and discussed.       Food allergy testing is negative.     - for eczema on the face recommend use of Eucrisa, a non-steroidal eczema cream, apply thin layer twice a day to inflamed, itchy areas during flares.  Provided with samples.  Let us know if this is effective for you.      Protopic or Elidel would be other topical options for use on face which are non-steroidal agents.    - for eczema on body will prescribe clobetasol to use thin layer daily to  inflamed, itchy areas  - continue Cetaphil moisturization at least twice a day  - after showering/bathing pat dry and then apply creams/lotions as above  Allergic rhinitis  - as above avoidance measures provided for pollens, dust mites, cat  - continue loratadine 10mg  daily as needed and daily during problematic seasons (spring)    Follow-up 6 months or sooner if needed  I appreciate the opportunity to take part in [place] care. Please do not hesitate to contact me with questions.  Sincerely,   Prudy Feeler, MD Allergy/Immunology [facility]

## 2017-10-09 NOTE — Patient Instructions (Addendum)
Eczema   - Environmental is positive to trees, weeds, dust mites, cat dog and molds.  Avoidance measures provided and discussed.       Food allergy testing is negative.     - for eczema on the face recommend use of Eucrisa, a non-steroidal eczema cream, apply thin layer twice a day to inflamed, itchy areas during flares.  Provided with samples.  Let us know if this is effective for you.      Protopic or Elidel would be other topical options for use on face which are non-steroidal agents.    - for eczema on body will prescribe clobetasol to use thin layer daily to inflamed, itchy areas  - continue Cetaphil moisturization at least twice a day  - after showering/bathing pat dry and then apply creams/lotions as above  Allergic rhinitis  - as above avoidance measures provided for pollens, dust mites, cat  - continue loratadine 10mg  daily as needed and daily during problematic seasons (spring)    Follow-up 6 months or sooner if needed

## 2017-10-10 ENCOUNTER — Telehealth: Payer: Self-pay

## 2017-10-10 NOTE — Telephone Encounter (Signed)
PA has been approved and faxed to pharmacy.

## 2017-10-10 NOTE — Telephone Encounter (Signed)
Received fax for PA for Eucrisa 2%. PA has been completed, sent to plan and is waiting for response.

## 2017-11-18 ENCOUNTER — Ambulatory Visit (INDEPENDENT_AMBULATORY_CARE_PROVIDER_SITE_OTHER): Payer: BC Managed Care – PPO | Admitting: Adult Health

## 2017-11-18 ENCOUNTER — Encounter: Payer: Self-pay | Admitting: Adult Health

## 2017-11-18 VITALS — BP 136/92 | HR 63 | Temp 99.0°F | Ht 63.5 in | Wt 197.1 lb

## 2017-11-18 DIAGNOSIS — R059 Cough, unspecified: Secondary | ICD-10-CM | POA: Insufficient documentation

## 2017-11-18 DIAGNOSIS — R05 Cough: Secondary | ICD-10-CM | POA: Diagnosis not present

## 2017-11-18 DIAGNOSIS — J019 Acute sinusitis, unspecified: Secondary | ICD-10-CM

## 2017-11-18 DIAGNOSIS — N926 Irregular menstruation, unspecified: Secondary | ICD-10-CM

## 2017-11-18 LAB — POCT URINE PREGNANCY: Preg Test, Ur: NEGATIVE

## 2017-11-18 MED ORDER — HYDROCOD POLST-CPM POLST ER 10-8 MG/5ML PO SUER: 5.0000 mL | Freq: Two times a day (BID) | ORAL | 0 refills | Status: DC | PRN

## 2017-11-18 MED ORDER — ALBUTEROL SULFATE 108 (90 BASE) MCG/ACT IN AEPB: 1.0000 | INHALATION_SPRAY | Freq: Four times a day (QID) | RESPIRATORY_TRACT | 0 refills | Status: DC | PRN

## 2017-11-18 MED ORDER — FLUTICASONE PROPIONATE 50 MCG/ACT NA SUSP: 2.0000 | Freq: Every day | NASAL | 6 refills | Status: DC

## 2017-11-18 MED ORDER — PREDNISONE 20 MG PO TABS: ORAL_TABLET | ORAL | 0 refills | Status: DC

## 2017-11-18 NOTE — Assessment & Plan Note (Signed)
Flonase, Prednisone taper Push fluids

## 2017-11-18 NOTE — Assessment & Plan Note (Signed)
Sx's present since 11/14/17 [facility] Controlled Substance Database reviewed- no aberrancies noted Tussionex, Prednsione taper, Albuterol  If sx's still present/worsening 11/20/17- then please call clinic and we will send Azithromycin rx

## 2017-11-18 NOTE — Progress Notes (Signed)
Subjective:    Patient ID: Katelyn Wright, female    DOB: 1976-04-27, 42 y.o.   MRN: [id number]  HPI:  Ms. Neth presents with constant, non-productive cough, copious thick/yellow nasal drainage, low grade fever (99.0 F), nausea without vomiting, and extreme fatigue that started Friday 11/14/17 and have been steadily worsening. She has been pushing fluids, resting, and taking OTC NyQuil with only minimal sx relief. She denies CP/palpitations, but reports chest tightness esp. When coughing. She denies tobacco use She is actively trying to conceive, pregnancy test today: negative Patient Care Team    Relationship Specialty Notifications Start End  Mellody Dance, DO PCP - General Family Medicine  12/30/16   Rayburn, Neta Mends, PA-C Physician Assistant Plastic Surgery  12/30/16   Kathie Rhodes, MD Consulting Physician Urology  12/30/16   Sherlyn Hay, DO Consulting Physician Obstetrics and Gynecology  12/30/16     Patient Active Problem List   Diagnosis Date Noted  . Abnormal menses 11/18/2017  . Cough in adult 11/18/2017  . Acute sinusitis 11/18/2017  . Insomnia 02/14/2017  . Vitamin D deficiency, with symptoms 01/10/2017  . Eczema-  all over-  12/30/2016  . 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 h/o Kidney stones 09/18/2015  . Obesity (BMI 30.0-34.9) 09/18/2015     Past Medical History:  Diagnosis Date  . Eczema   . Heart murmur   . Seasonal allergies      Past Surgical History:  Procedure Laterality Date  . ADENOIDECTOMY    . CYSTOSCOPY WITH URETEROSCOPY AND STENT PLACEMENT Right 09/29/2015   Procedure: RIGHT  URETEROSCOPY AND STENT PLACEMENT, RETROGRADE;  Surgeon: Kathie Rhodes, MD;  Location: [facility];  Service: Urology;  Laterality: Right;  . HOLMIUM LASER APPLICATION Right 04/11/1940   Procedure: WITH HOLMIUM LASER;  Surgeon: Kathie Rhodes, MD;  Location: [facility];  Service: Urology;  Laterality: Right;  . right stent  placed     Right Renal Stent  . TONSILLECTOMY AND ADENOIDECTOMY  1994     Family History  Problem Relation Age of Onset  . Arthritis Mother   . Hypertension Mother   . Diabetes Mother   . Hypertension Father   . Alcohol abuse Maternal Uncle   . Heart disease Maternal Grandfather   . Breast cancer Paternal Grandmother      Social History   Substance and Sexual Activity  Drug Use No     Social History   Substance and Sexual Activity  Alcohol Use No     Social History   Tobacco Use  Smoking Status Never Smoker  Smokeless Tobacco Never Used     Outpatient Encounter Medications as of 11/18/2017  Medication Sig  . Cholecalciferol (VITAMIN D3) 5000 units TABS 5,000 IU OTC vitamin D3 daily.  . clobetasol ointment (TEMOVATE) 7.40 % Apply 1 application topically daily. To inflamed, itchy areas  . Crisaborole (EUCRISA) 2 % OINT Apply 1 application topically 2 (two) times daily. To inflamed, itchy areas during flares  . Prenatal Vit-Fe Fumarate-FA (MULTIVITAMIN-PRENATAL) 27-0.8 MG TABS tablet Take 1 tablet by mouth daily at 12 noon.  . Probiotic Product (PROBIOTIC ADVANCED PO) Take 1 capsule by mouth daily.  . Vitamin D, Ergocalciferol, (DRISDOL) 50000 units CAPS capsule Take 1 capsule (50,000 Units total) by mouth every 7 (seven) days.  . Albuterol Sulfate (PROAIR RESPICLICK) 814 (90 Base) MCG/ACT AEPB Inhale 1 puff into the lungs 4 (four) times daily as needed.  . chlorpheniramine-HYDROcodone (TUSSIONEX) 10-8 MG/5ML SUER  Take 5 mLs by mouth every 12 (twelve) hours as needed.  . fluticasone (FLONASE) 50 MCG/ACT nasal spray Place 2 sprays into both nostrils daily.  . predniSONE (DELTASONE) 20 MG tablet 1 tab every 12 hrs for three days. 1 tab daily for three days  . [DISCONTINUED] triamcinolone (KENALOG) 0.025 % cream Apply 1 application topically daily as needed (for eczema).  . [DISCONTINUED] XIIDRA 5 % SOLN PLACE 1 DROP INTO BOTH EYES TWICE A DAY   No  facility-administered encounter medications on file as of 11/18/2017.     Allergies: Cefdinir  Body mass index is 34.37 kg/m.  Blood pressure (!) 136/92, pulse 63, temperature 99 F (37.2 C), temperature source Oral, height 5' 3.5" (1.613 m), weight 197 lb 1.6 oz (89.4 kg), last menstrual period 11/10/2017, SpO2 98 %.  Review of Systems  Constitutional: Positive for activity change, fatigue and fever. Negative for appetite change, chills, diaphoresis and unexpected weight change.  HENT: Positive for congestion, postnasal drip, rhinorrhea, sinus pressure, sinus pain, sore throat and voice change.   Eyes: Negative for visual disturbance.  Respiratory: Positive for cough and chest tightness. Negative for shortness of breath, wheezing and stridor.   Cardiovascular: Negative for chest pain, palpitations and leg swelling.  Gastrointestinal: Positive for nausea. Negative for abdominal distention, abdominal pain, blood in stool, constipation, diarrhea and vomiting.  Endocrine: Negative for cold intolerance, heat intolerance, polydipsia, polyphagia and polyuria.  Genitourinary: Negative for difficulty urinating and flank pain.  Neurological: Positive for dizziness. Negative for headaches.  Hematological: Does not bruise/bleed easily.  Psychiatric/Behavioral: Positive for sleep disturbance.       Objective:   Physical Exam  Constitutional: She appears well-developed and well-nourished. No distress.  HENT:  Head: Normocephalic and atraumatic.  Right Ear: External ear normal. Tympanic membrane is bulging. Tympanic membrane is not erythematous. No decreased hearing is noted.  Left Ear: External ear normal. Tympanic membrane is bulging. Tympanic membrane is not erythematous. No decreased hearing is noted.  Nose: Mucosal edema and rhinorrhea present. Right sinus exhibits maxillary sinus tenderness. Right sinus exhibits no frontal sinus tenderness. Left sinus exhibits maxillary sinus tenderness.  Left sinus exhibits no frontal sinus tenderness.  Mouth/Throat: Uvula is midline and mucous membranes are normal. Posterior oropharyngeal edema and posterior oropharyngeal erythema present. No oropharyngeal exudate or tonsillar abscesses.  Eyes: Pupils are equal, round, and reactive to light. EOM are normal.  Neck: Normal range of motion. Neck supple.  Cardiovascular: Normal rate, regular rhythm, normal heart sounds and intact distal pulses.  No murmur heard. Pulmonary/Chest: Effort normal and breath sounds normal. No stridor. No respiratory distress. She has no wheezes. She has no rales. She exhibits no tenderness.  Lymphadenopathy:    She has no cervical adenopathy.  Skin: Skin is warm and dry. No rash noted. She is not diaphoretic. No erythema. No pallor.  Psychiatric: She has a normal mood and affect. Her behavior is normal. Judgment and thought content normal.  Nursing note and vitals reviewed.     Assessment & Plan:   1. Abnormal menses   2. Cough in adult   3. Acute sinusitis, recurrence not specified, unspecified location     Abnormal menses Pregnancy test- negative today  Cough in adult Sx's present since 11/14/17 [facility] Controlled Substance Database reviewed- no aberrancies noted Tussionex, Prednsione taper, Albuterol  If sx's still present/worsening 11/20/17- then please call clinic and we will send Azithromycin rx  Acute sinusitis Flonase, Prednisone taper Push fluids    FOLLOW-UP:  Return  if symptoms worsen or fail to improve.

## 2017-11-18 NOTE — Patient Instructions (Addendum)
Cough, Adult Coughing is a reflex that clears your throat and your airways. Coughing helps to heal and protect your lungs. It is normal to cough occasionally, but a cough that happens with other symptoms or lasts a long time may be a sign of a condition that needs treatment. A cough may last only 2-3 weeks (acute), or it may last longer than 8 weeks (chronic). What are the causes? Coughing is commonly caused by:  Breathing in substances that irritate your lungs.  A viral or bacterial respiratory infection.  Allergies.  Asthma.  Postnasal drip.  Smoking.  Acid backing up from the stomach into the esophagus (gastroesophageal reflux).  Certain medicines.  Chronic lung problems, including COPD (or rarely, lung cancer).  Other medical conditions such as heart failure.  Follow these instructions at home: Pay attention to any changes in your symptoms. Take these actions to help with your discomfort:  Take medicines only as told by your health care provider. ? If you were prescribed an antibiotic medicine, take it as told by your health care provider. Do not stop taking the antibiotic even if you start to feel better. ? Talk with your health care provider before you take a cough suppressant medicine.  Drink enough fluid to keep your urine clear or pale yellow.  If the air is dry, use a cold steam vaporizer or humidifier in your bedroom or your home to help loosen secretions.  Avoid anything that causes you to cough at work or at home.  If your cough is worse at night, try sleeping in a semi-upright position.  Avoid cigarette smoke. If you smoke, quit smoking. If you need help quitting, ask your health care provider.  Avoid caffeine.  Avoid alcohol.  Rest as needed.  Contact a health care provider if:  You have new symptoms.  You cough up pus.  Your cough does not get better after 2-3 weeks, or your cough gets worse.  You cannot control your cough with suppressant  medicines and you are losing sleep.  You develop pain that is getting worse or pain that is not controlled with pain medicines.  You have a fever.  You have unexplained weight loss.  You have night sweats. Get help right away if:  You cough up blood.  You have difficulty breathing.  Your heartbeat is very fast. This information is not intended to replace advice given to you by your health care provider. Make sure you discuss any questions you have with your health care provider. Document Released: 01/04/2011 Document Revised: 12/14/2015 Document Reviewed: 09/14/2014 Elsevier Interactive Patient Education  2018 [employer].   Sinusitis, Adult Sinusitis is soreness and inflammation of your sinuses. Sinuses are hollow spaces in the bones around your face. Your sinuses are located:  Around your eyes.  In the middle of your forehead.  Behind your nose.  In your cheekbones.  Your sinuses and nasal passages are lined with a stringy fluid (mucus). Mucus normally drains out of your sinuses. When your nasal tissues become inflamed or swollen, the mucus can become trapped or blocked so air cannot flow through your sinuses. This allows bacteria, viruses, and funguses to grow, which leads to infection. Sinusitis can develop quickly and last for 7?10 days (acute) or for more than 12 weeks (chronic). Sinusitis often develops after a cold. What are the causes? This condition is caused by anything that creates swelling in the sinuses or stops mucus from draining, including:  Allergies.  Asthma.  Bacterial or viral  infection.  Abnormally shaped bones between the nasal passages.  Nasal growths that contain mucus (nasal polyps).  Narrow sinus openings.  Pollutants, such as chemicals or irritants in the air.  A foreign object stuck in the nose.  A fungal infection. This is rare.  What increases the risk? The following factors may make you more likely to develop this  condition:  Having allergies or asthma.  Having had a recent cold or respiratory tract infection.  Having structural deformities or blockages in your nose or sinuses.  Having a weak immune system.  Doing a lot of swimming or diving.  Overusing nasal sprays.  Smoking.  What are the signs or symptoms? The main symptoms of this condition are pain and a feeling of pressure around the affected sinuses. Other symptoms include:  Upper toothache.  Earache.  Headache.  Bad breath.  Decreased sense of smell and taste.  A cough that may get worse at night.  Fatigue.  Fever.  Thick drainage from your nose. The drainage is often green and it may contain pus (purulent).  Stuffy nose or congestion.  Postnasal drip. This is when extra mucus collects in the throat or back of the nose.  Swelling and warmth over the affected sinuses.  Sore throat.  Sensitivity to light.  How is this diagnosed? This condition is diagnosed based on symptoms, a medical history, and a physical exam. To find out if your condition is acute or chronic, your health care provider may:  Look in your nose for signs of nasal polyps.  Tap over the affected sinus to check for signs of infection.  View the inside of your sinuses using an imaging device that has a light attached (endoscope).  If your health care provider suspects that you have chronic sinusitis, you may also:  Be tested for allergies.  Have a sample of mucus taken from your nose (nasal culture) and checked for bacteria.  Have a mucus sample examined to see if your sinusitis is related to an allergy.  If your sinusitis does not respond to treatment and it lasts longer than 8 weeks, you may have an MRI or CT scan to check your sinuses. These scans also help to determine how severe your infection is. In rare cases, a bone biopsy may be done to rule out more serious types of fungal sinus disease. How is this treated? Treatment for  sinusitis depends on the cause and whether your condition is chronic or acute. If a virus is causing your sinusitis, your symptoms will go away on their own within 10 days. You may be given medicines to relieve your symptoms, including:  Topical nasal decongestants. They shrink swollen nasal passages and let mucus drain from your sinuses.  Antihistamines. These drugs block inflammation that is triggered by allergies. This can help to ease swelling in your nose and sinuses.  Topical nasal corticosteroids. These are nasal sprays that ease inflammation and swelling in your nose and sinuses.  Nasal saline washes. These rinses can help to get rid of thick mucus in your nose.  If your condition is caused by bacteria, you will be given an antibiotic medicine. If your condition is caused by a fungus, you will be given an antifungal medicine. Surgery may be needed to correct underlying conditions, such as narrow nasal passages. Surgery may also be needed to remove polyps. Follow these instructions at home: Medicines  Take, use, or apply over-the-counter and prescription medicines only as told by your health care provider. These  may include nasal sprays.  If you were prescribed an antibiotic medicine, take it as told by your health care provider. Do not stop taking the antibiotic even if you start to feel better. Hydrate and Humidify  Drink enough water to keep your urine clear or pale yellow. Staying hydrated will help to thin your mucus.  Use a cool mist humidifier to keep the humidity level in your home above 50%.  Inhale steam for 10-15 minutes, 3-4 times a day or as told by your health care provider. You can do this in the bathroom while a hot shower is running.  Limit your exposure to cool or dry air. Rest  Rest as much as possible.  Sleep with your head raised (elevated).  Make sure to get enough sleep each night. General instructions  Apply a warm, moist washcloth to your face 3-4  times a day or as told by your health care provider. This will help with discomfort.  Wash your hands often with soap and water to reduce your exposure to viruses and other germs. If soap and water are not available, use hand sanitizer.  Do not smoke. Avoid being around people who are smoking (secondhand smoke).  Keep all follow-up visits as told by your health care provider. This is important. Contact a health care provider if:  You have a fever.  Your symptoms get worse.  Your symptoms do not improve within 10 days. Get help right away if:  You have a severe headache.  You have persistent vomiting.  You have pain or swelling around your face or eyes.  You have vision problems.  You develop confusion.  Your neck is stiff.  You have trouble breathing. This information is not intended to replace advice given to you by your health care provider. Make sure you discuss any questions you have with your health care provider. Document Released: 07/08/2005 Document Revised: 03/03/2016 Document Reviewed: 05/03/2015 Elsevier Interactive Patient Education  2018 [employer].  Please take Prednisone as directed and Tussionex and ProAir as needed. Increase fluids/rest/vit c-2,000mg /daily when not feeling well. Alternate OTC Acetaminophen and Ibuprofen as needed for fever/discomfort. If symptoms worsen/persist at end of week, please all clinic and we will send in Azithromycin. FEEL BETTER!

## 2017-11-18 NOTE — Assessment & Plan Note (Signed)
Pregnancy test- negative today

## 2017-12-09 ENCOUNTER — Telehealth: Payer: Self-pay | Admitting: Family Medicine

## 2017-12-09 ENCOUNTER — Encounter: Payer: Self-pay | Admitting: Family Medicine

## 2017-12-09 NOTE — Telephone Encounter (Signed)
Patient also sen MyChart message with Picture.  Sent message to Dr. Raliegh Scarlet to review. MPulliam, CMA/RT(R)

## 2017-12-09 NOTE — Telephone Encounter (Signed)
Patient called and wanted me to send this first, she stated she has developed a cold sore and is requesting a prescription to be sent to her [facility] on [address]. Please advise

## 2017-12-10 ENCOUNTER — Encounter: Payer: Self-pay | Admitting: Family Medicine

## 2017-12-11 ENCOUNTER — Ambulatory Visit (INDEPENDENT_AMBULATORY_CARE_PROVIDER_SITE_OTHER): Payer: BC Managed Care – PPO | Admitting: Family Medicine

## 2017-12-11 ENCOUNTER — Encounter: Payer: Self-pay | Admitting: Family Medicine

## 2017-12-11 VITALS — BP 137/88 | HR 69 | Ht 63.5 in | Wt 197.0 lb

## 2017-12-11 DIAGNOSIS — J3089 Other allergic rhinitis: Secondary | ICD-10-CM

## 2017-12-11 DIAGNOSIS — B001 Herpesviral vesicular dermatitis: Secondary | ICD-10-CM | POA: Diagnosis not present

## 2017-12-11 DIAGNOSIS — B009 Herpesviral infection, unspecified: Secondary | ICD-10-CM

## 2017-12-11 MED ORDER — VALACYCLOVIR HCL 500 MG PO TABS: 500.0000 mg | ORAL_TABLET | Freq: Two times a day (BID) | ORAL | 2 refills | Status: AC

## 2017-12-11 NOTE — Patient Instructions (Signed)
You can use over-the-counter afrin nasal spray for up to 3 days (NO longer than that) which will help acutely with nasal drainage/ congestion short term.     Also, sterile saline nasal rinses, such as Milta Deiters med or AYR sinus rinses, can be very helpful and should be done twice daily- especially throughout the allergy season.   Remember you should use distilled water or previously boiled water to do this.  Then you may use over-the-counter Flonase 1 spray each nostril twice daily after sinus rinses.  You can do this in addition to taking any Allegra or Claritin or Zyrtec etc. that you may be taking daily.  If your eyes tend to get an itchy or irritated feeling when your seasonal allergies get bad, you can use Naphcon-A over-the-counter eyedrops as needed

## 2017-12-11 NOTE — Progress Notes (Signed)
Pt here for an acute care OV today   Impression and Recommendations:    1. 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 HSV infection   3. Environmental and seasonal allergies - now worse and acutely flaired up    1. Cold sore/HSV infection -Valtrex given today for future outbreaks. -Educated pt in great detail about HSV-1 and various treatment plans. Handouts and information provided.  - pt labs reviewed by me in chart - pt will get me medical records from prior provider for my review of labs/ tests done to confirm dx as pt declines viral culture today of her cold sore today.  2. Env/seasonal allergies - acutely Worse -use DAILY allegra D or zyrtec D.  -use sinus rinse followed by 1 spray of flonase in each nostril.   -wash your face after going outside.  - handouts given to pt     Meds ordered this encounter  Medications  . valACYclovir (VALTREX) 500 MG tablet    Sig: Take 1 tablet (500 mg total) by mouth 2 (two) times daily for 3 days.    Dispense:  6 tablet    Refill:  2     Education and routine counseling performed. Handouts provided  Gross side effects, risk and benefits, and alternatives of medications and treatment plan in general discussed with patient.  Patient is aware that all medications have potential side effects and we are unable to predict every side effect or drug-drug interaction that may occur.   Patient will call with any questions prior to using medication if they have concerns.  Expresses verbal understanding and consents to current therapy and treatment regimen.  No barriers to understanding were identified.  Red flag symptoms and signs discussed in detail.  Patient expressed understanding regarding what to do in case of emergency\urgent symptoms   Please see AVS handed out to patient at the end of our visit for further patient instructions/ counseling done pertaining to today's office visit.   Return for F-up of current med  issues as previously d/c pt.    This document serves as a record of services personally performed by Mellody Dance, DO. It was created on her behalf by Mayer Masker, a trained medical scribe. The creation of this record is based on the scribe's personal observations and the provider's statements to them.   I have reviewed the above medical documentation for accuracy and completeness and I concur.  Mellody Dance 12/11/17 2:43 PM  --------------------------------------------------------------------------------------------------------------------------------------------------------------------------------------------------------------------------------------------    Subjective:    CC:  Chief Complaint  Patient presents with  . Mass    lower lip    HPI: Katelyn Wright is a 42 y.o. female who presents to [place] [facility] today for a lip lesion on her lower lip.   She complains of a bump on her lower lip that began 2 days ago. She states she was drinking a new coffee and had symptoms of itchiness in her ear and soreness in her eye, then was starting to get hives on her lips, which is common for her. She bites her lip in the same location and describes a divot in her lip in this location. She had swelling in the area and put ice on her lip which did not help her symptoms. She put abreva on her lip within 8 hours of onset without relief. She took a benadryl which improved her itch. She currently denies itching or pain  to the area.   She was seen in office last month for a sinus infection/sinusitis and was prescribed prednisone, which she finished. She did not want abx at that time due to potentially getting pregnant.   She has a h/o eczema in her foot. She gets blisters in her feet that are relieved when she pops/mashes them.    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   No problems updated.   Wt Readings from Last 3 Encounters:  12/11/17 197 lb  (89.4 kg)  11/18/17 197 lb 1.6 oz (89.4 kg)  10/09/17 198 lb 12.8 oz (90.2 kg)   BP Readings from Last 3 Encounters:  12/11/17 137/88  11/18/17 (!) 136/92  10/09/17 122/72   BMI Readings from Last 3 Encounters:  12/11/17 34.35 kg/m  11/18/17 34.37 kg/m  10/09/17 34.66 kg/m     Patient Care Team    Relationship Specialty Notifications Start End  Mellody Dance, DO PCP - General Family Medicine  12/30/16   Rayburn, Neta Mends, PA-C Physician Assistant Plastic Surgery  12/30/16   Kathie Rhodes, MD Consulting Physician Urology  12/30/16   Sherlyn Hay, DO Consulting Physician Obstetrics and Gynecology  12/30/16      Patient Active Problem List   Diagnosis Date Noted  . Environmental and seasonal allergies 12/11/2017  . 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 Cold sore 12/11/2017  . Abnormal menses 11/18/2017  . Cough in adult 11/18/2017  . Acute sinusitis 11/18/2017  . Insomnia 02/14/2017  . Vitamin D deficiency, with symptoms 01/10/2017  . Eczema-  all over-  12/30/2016  . 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 h/o Kidney stones 09/18/2015  . Obesity (BMI 30.0-34.9) 09/18/2015    Past Medical history, Surgical history, Family history, Social history, Allergies and Medications have been entered into the medical record, reviewed and changed as needed.    Current Meds  Medication Sig  . Cholecalciferol (VITAMIN D3) 5000 units TABS 5,000 IU OTC vitamin D3 daily.  . clobetasol ointment (TEMOVATE) 2.44 % Apply 1 application topically daily. To inflamed, itchy areas  . Crisaborole (EUCRISA) 2 % OINT Apply 1 application topically 2 (two) times daily. To inflamed, itchy areas during flares  . Docosanol (ABREVA EX) Apply topically.  . Prenatal Vit-Fe Fumarate-FA (MULTIVITAMIN-PRENATAL) 27-0.8 MG TABS tablet Take 1 tablet by mouth daily at 12 noon.  . Probiotic Product (PROBIOTIC ADVANCED PO) Take 1 capsule by mouth daily.  . Vitamin  D, Ergocalciferol, (DRISDOL) 50000 units CAPS capsule Take 1 capsule (50,000 Units total) by mouth every 7 (seven) days.    Allergies:  Allergies  Allergen Reactions  . Cefdinir Rash     Review of Systems: General:   Denies fever, chills, unexplained weight loss.  Optho/Auditory:   Denies visual changes, blurred vision/LOV Respiratory:   Denies wheeze, DOE more than baseline levels.  Cardiovascular:   Denies chest pain, palpitations, new onset peripheral edema  Gastrointestinal:   Denies nausea, vomiting, diarrhea, abd pain.  Genitourinary: Denies dysuria, freq/ urgency, flank pain or discharge from genitals.  Endocrine:     Denies hot or cold intolerance, polyuria, polydipsia. Musculoskeletal:   Denies unexplained myalgias, joint swelling, unexplained arthralgias, gait problems.  Skin:  Denies new onset rash, suspicious lesions Neurological:     Denies dizziness, unexplained weakness, numbness  Psychiatric/Behavioral:   Denies mood changes, suicidal or homicidal ideations, hallucinations    Objective:   Blood pressure 137/88, pulse 69, height 5'  3.5" (1.613 m), weight 197 lb (89.4 kg), SpO2 98 %. Body mass index is 34.35 kg/m. General:  Well Developed, well nourished, appropriate for stated age.  Neuro:  Alert and oriented,  extra-ocular muscles intact  HEENT:  Normocephalic, atraumatic, neck supple Skin:  no gross rash, warm, pink. Cardiac:  RRR, S1 S2 Respiratory:  ECTA B/L and A/P, Not using accessory muscles, speaking in full sentences- unlabored. Vascular:  Ext warm, no cyanosis apprec.; cap RF less 2 sec. Psych:  No HI/SI, judgement and insight good, Euthymic mood. Full Affect.

## 2018-01-20 ENCOUNTER — Encounter: Payer: Self-pay | Admitting: Family Medicine

## 2018-01-20 ENCOUNTER — Ambulatory Visit (INDEPENDENT_AMBULATORY_CARE_PROVIDER_SITE_OTHER): Payer: BC Managed Care – PPO | Admitting: Family Medicine

## 2018-01-20 VITALS — BP 125/85 | HR 68 | Ht 64.0 in | Wt 201.6 lb

## 2018-01-20 DIAGNOSIS — F5102 Adjustment insomnia: Secondary | ICD-10-CM

## 2018-01-20 DIAGNOSIS — E860 Dehydration: Secondary | ICD-10-CM

## 2018-01-20 DIAGNOSIS — Z719 Counseling, unspecified: Secondary | ICD-10-CM | POA: Diagnosis not present

## 2018-01-20 DIAGNOSIS — E669 Obesity, unspecified: Secondary | ICD-10-CM

## 2018-01-20 DIAGNOSIS — K5904 Chronic idiopathic constipation: Secondary | ICD-10-CM | POA: Diagnosis not present

## 2018-01-20 DIAGNOSIS — Z0000 Encounter for general adult medical examination without abnormal findings: Secondary | ICD-10-CM

## 2018-01-20 DIAGNOSIS — J3089 Other allergic rhinitis: Secondary | ICD-10-CM | POA: Diagnosis not present

## 2018-01-20 DIAGNOSIS — R42 Dizziness and giddiness: Secondary | ICD-10-CM | POA: Diagnosis not present

## 2018-01-20 DIAGNOSIS — Z713 Dietary counseling and surveillance: Secondary | ICD-10-CM | POA: Diagnosis not present

## 2018-01-20 DIAGNOSIS — T7840XS Allergy, unspecified, sequela: Secondary | ICD-10-CM

## 2018-01-20 DIAGNOSIS — G4700 Insomnia, unspecified: Secondary | ICD-10-CM | POA: Diagnosis not present

## 2018-01-20 DIAGNOSIS — E559 Vitamin D deficiency, unspecified: Secondary | ICD-10-CM | POA: Diagnosis not present

## 2018-01-20 DIAGNOSIS — Z0001 Encounter for general adult medical examination with abnormal findings: Secondary | ICD-10-CM

## 2018-01-20 MED ORDER — ZOLPIDEM TARTRATE ER 12.5 MG PO TBCR: 12.5000 mg | EXTENDED_RELEASE_TABLET | Freq: Every evening | ORAL | 0 refills | Status: DC | PRN

## 2018-01-20 NOTE — Patient Instructions (Addendum)
Please call your insurance and let me know who in the area you would like to be referred to for Dermatology, who accepts your insurance.    Preventive Care for Adults, Female  A healthy lifestyle and preventive care can promote health and wellness. Preventive health guidelines for women include the following key practices.   A routine yearly physical is a good way to check with your health care provider about your health and preventive screening. It is a chance to share any concerns and updates on your health and to receive a thorough exam.   Visit your dentist for a routine exam and preventive care every 6 months. Brush your teeth twice a day and floss once a day. Good oral hygiene prevents tooth decay and gum disease.   The frequency of eye exams is based on your age, health, family medical history, use of contact lenses, and other factors. Follow your health care provider's recommendations for frequency of eye exams.   Eat a healthy diet. Foods like vegetables, fruits, whole grains, low-fat dairy products, and lean protein foods contain the nutrients you need without too many calories. Decrease your intake of foods high in solid fats, added sugars, and salt. Eat the right amount of calories for you.Get information about a proper diet from your health care provider, if necessary.   Regular physical exercise is one of the most important things you can do for your health. Most adults should get at least 150 minutes of moderate-intensity exercise (any activity that increases your heart rate and causes you to sweat) each week. In addition, most adults need muscle-strengthening exercises on 2 or more days a week.   Maintain a healthy weight. The body mass index (BMI) is a screening tool to identify possible weight problems. It provides an estimate of body fat based on height and weight. Your health care provider can find your BMI, and can help you achieve or maintain a healthy weight.For  adults 20 years and older:   - A BMI below 18.5 is considered underweight.   - A BMI of 18.5 to 24.9 is normal.   - A BMI of 25 to 29.9 is considered overweight.   - A BMI of 30 and above is considered obese.   Maintain normal blood lipids and cholesterol levels by exercising and minimizing your intake of trans and saturated fats.  Eat a balanced diet with plenty of fruit and vegetables. Blood tests for lipids and cholesterol should begin at age 97 and be repeated every 5 years minimum.  If your lipid or cholesterol levels are high, you are over 40, or you are at high risk for heart disease, you may need your cholesterol levels checked more frequently.Ongoing high lipid and cholesterol levels should be treated with medicines if diet and exercise are not working.   If you smoke, find out from your health care provider how to quit. If you do not use tobacco, do not start.   Lung cancer screening is recommended for adults aged 61-80 years who are at high risk for developing lung cancer because of a history of smoking. A yearly low-dose CT scan of the lungs is recommended for people who have at least a 30-pack-year history of smoking and are a current smoker or have quit within the past 15 years. A pack year of smoking is smoking an average of 1 pack of cigarettes a day for 1 year (for example: 1 pack a day for 30 years or 2 packs a  day for 15 years). Yearly screening should continue until the smoker has stopped smoking for at least 15 years. Yearly screening should be stopped for people who develop a health problem that would prevent them from having lung cancer treatment.   If you are pregnant, do not drink alcohol. If you are breastfeeding, be very cautious about drinking alcohol. If you are not pregnant and choose to drink alcohol, do not have more than 1 drink per day. One drink is considered to be 12 ounces (355 mL) of beer, 5 ounces (148 mL) of wine, or 1.5 ounces (44 mL) of liquor.   Avoid  use of street drugs. Do not share needles with anyone. Ask for help if you need support or instructions about stopping the use of drugs.   High blood pressure causes heart disease and increases the risk of stroke. Your blood pressure should be checked at least yearly.  Ongoing high blood pressure should be treated with medicines if weight loss and exercise do not work.   If you are 38-28 years old, ask your health care provider if you should take aspirin to prevent strokes.   Diabetes screening involves taking a blood sample to check your fasting blood sugar level. This should be done once every 3 years, after age 28, if you are within normal weight and without risk factors for diabetes. Testing should be considered at a younger age or be carried out more frequently if you are overweight and have at least 1 risk factor for diabetes.   Breast cancer screening is essential preventive care for women. You should practice "breast self-awareness."  This means understanding the normal appearance and feel of your breasts and may include breast self-examination.  Any changes detected, no matter how small, should be reported to a health care provider.  Women in their 66s and 30s should have a clinical breast exam (CBE) by a health care provider as part of a regular health exam every 1 to 3 years.  After age 92, women should have a CBE every year.  Starting at age 9, women should consider having a mammogram (breast X-ray test) every year.  Women who have a family history of breast cancer should talk to their health care provider about genetic screening.  Women at a high risk of breast cancer should talk to their health care providers about having an MRI and a mammogram every year.   -Breast cancer gene (BRCA)-related cancer risk assessment is recommended for women who have family members with BRCA-related cancers. BRCA-related cancers include breast, ovarian, tubal, and peritoneal cancers. Having family members  with these cancers may be associated with an increased risk for harmful changes (mutations) in the breast cancer genes BRCA1 and BRCA2. Results of the assessment will determine the need for genetic counseling and BRCA1 and BRCA2 testing.   The Pap test is a screening test for cervical cancer. A Pap test can show cell changes on the cervix that might become cervical cancer if left untreated. A Pap test is a procedure in which cells are obtained and examined from the lower end of the uterus (cervix).   - Women should have a Pap test starting at age 75.   - Between ages 6 and 59, Pap tests should be repeated every 2 years.   - Beginning at age 33, you should have a Pap test every 3 years as long as the past 3 Pap tests have been normal.   - Some women have medical problems that  increase the chance of getting cervical cancer. Talk to your health care provider about these problems. It is especially important to talk to your health care provider if a new problem develops soon after your last Pap test. In these cases, your health care provider may recommend more frequent screening and Pap tests.   - The above recommendations are the same for women who have or have not gotten the vaccine for human papillomavirus (HPV).   - If you had a hysterectomy for a problem that was not cancer or a condition that could lead to cancer, then you no longer need Pap tests. Even if you no longer need a Pap test, a regular exam is a good idea to make sure no other problems are starting.   - If you are between ages 53 and 63 years, and you have had normal Pap tests going back 10 years, you no longer need Pap tests. Even if you no longer need a Pap test, a regular exam is a good idea to make sure no other problems are starting.   - If you have had past treatment for cervical cancer or a condition that could lead to cancer, you need Pap tests and screening for cancer for at least 20 years after your treatment.   - If Pap  tests have been discontinued, risk factors (such as a new sexual partner) need to be reassessed to determine if screening should be resumed.   - The HPV test is an additional test that may be used for cervical cancer screening. The HPV test looks for the virus that can cause the cell changes on the cervix. The cells collected during the Pap test can be tested for HPV. The HPV test could be used to screen women aged 75 years and older, and should be used in women of any age who have unclear Pap test results. After the age of 26, women should have HPV testing at the same frequency as a Pap test.   Colorectal cancer can be detected and often prevented. Most routine colorectal cancer screening begins at the age of 88 years and continues through age 74 years. However, your health care provider may recommend screening at an earlier age if you have risk factors for colon cancer. On a yearly basis, your health care provider may provide home test kits to check for hidden blood in the stool.  Use of a small camera at the end of a tube, to directly examine the colon (sigmoidoscopy or colonoscopy), can detect the earliest forms of colorectal cancer. Talk to your health care provider about this at age 40, when routine screening begins. Direct exam of the colon should be repeated every 5 -10 years through age 25 years, unless early forms of pre-cancerous polyps or small growths are found.   People who are at an increased risk for hepatitis B should be screened for this virus. You are considered at high risk for hepatitis B if:  -You were born in a country where hepatitis B occurs often. Talk with your health care provider about which countries are considered high risk.  - Your parents were born in a high-risk country and you have not received a shot to protect against hepatitis B (hepatitis B vaccine).  - You have HIV or AIDS.  - You use needles to inject street drugs.  - You live with, or have sex with, someone who  has Hepatitis B.  - You get hemodialysis treatment.  - You take certain  medicines for conditions like cancer, organ transplantation, and autoimmune conditions.   Hepatitis C blood testing is recommended for all people born from [birth year] through 1965 and any individual with known risks for hepatitis C.   Practice safe sex. Use condoms and avoid high-risk sexual practices to reduce the spread of sexually transmitted infections (STIs). STIs include gonorrhea, chlamydia, syphilis, trichomonas, herpes, HPV, and human immunodeficiency virus (HIV). Herpes, HIV, and HPV are viral illnesses that have no cure. They can result in disability, cancer, and death. Sexually active women aged 24 years and younger should be checked for chlamydia. Older women with new or multiple partners should also be tested for chlamydia. Testing for other STIs is recommended if you are sexually active and at increased risk.   Osteoporosis is a disease in which the bones lose minerals and strength with aging. This can result in serious bone fractures or breaks. The risk of osteoporosis can be identified using a bone density scan. Women ages 54 years and over and women at risk for fractures or osteoporosis should discuss screening with their health care providers. Ask your health care provider whether you should take a calcium supplement or vitamin D to There are also several preventive steps women can take to avoid osteoporosis and resulting fractures or to keep osteoporosis from worsening. -->Recommendations include:  Eat a balanced diet high in fruits, vegetables, calcium, and vitamins.  Get enough calcium. The recommended total intake of is 1,200 mg daily; for best absorption, if taking supplements, divide doses into 250-500 mg doses throughout the day. Of the two types of calcium, calcium carbonate is best absorbed when taken with food but calcium citrate can be taken on an empty stomach.  Get enough vitamin D. [employer] and the  [place] recommend at least 1,000 IU per day for women age 23 and over who are at risk of vitamin D deficiency. Vitamin D deficiency can be caused by inadequate sun exposure (for example, those who live in [place]).  Avoid alcohol and smoking. Heavy alcohol intake (more than 7 drinks per week) increases the risk of falls and hip fracture and women smokers tend to lose bone more rapidly and have lower bone mass than nonsmokers. Stopping smoking is one of the most important changes women can make to improve their health and decrease risk for disease.  Be physically active every day. Weight-bearing exercise (for example, fast walking, hiking, jogging, and weight training) may strengthen bones or slow the rate of bone loss that comes with aging. Balancing and muscle-strengthening exercises can reduce the risk of falling and fracture.  Consider therapeutic medications. Currently, several types of effective drugs are available. Healthcare providers can recommend the type most appropriate for each woman.  Eliminate environmental factors that may contribute to accidents. Falls cause nearly 90% of all osteoporotic fractures, so reducing this risk is an important bone-health strategy. Measures include ample lighting, removing obstructions to walking, using nonskid rugs on floors, and placing mats and/or grab bars in showers.  Be aware of medication side effects. Some common medicines make bones weaker. These include a type of steroid drug called glucocorticoids used for arthritis and asthma, some antiseizure drugs, certain sleeping pills, treatments for endometriosis, and some cancer drugs. An overactive thyroid gland or using too much thyroid hormone for an underactive thyroid can also be a problem. If you are taking these medicines, talk to your doctor about what you can do to help protect your bones.reduce the rate of osteoporosis.  Menopause can be associated with  physical symptoms and risks. Hormone replacement therapy is available to decrease symptoms and risks. You should talk to your health care provider about whether hormone replacement therapy is right for you.   Use sunscreen. Apply sunscreen liberally and repeatedly throughout the day. You should seek shade when your shadow is shorter than you. Protect yourself by wearing long sleeves, pants, a wide-brimmed hat, and sunglasses year round, whenever you are outdoors.   Once a month, do a whole body skin exam, using a mirror to look at the skin on your back. Tell your health care provider of new moles, moles that have irregular borders, moles that are larger than a pencil eraser, or moles that have changed in shape or color.   -Stay current with required vaccines (immunizations).   Influenza vaccine. All adults should be immunized every year.  Tetanus, diphtheria, and acellular pertussis (Td, Tdap) vaccine. Pregnant women should receive 1 dose of Tdap vaccine during each pregnancy. The dose should be obtained regardless of the length of time since the last dose. Immunization is preferred during the 27th 36th week of gestation. An adult who has not previously received Tdap or who does not know her vaccine status should receive 1 dose of Tdap. This initial dose should be followed by tetanus and diphtheria toxoids (Td) booster doses every 10 years. Adults with an unknown or incomplete history of completing a 3-dose immunization series with Td-containing vaccines should begin or complete a primary immunization series including a Tdap dose. Adults should receive a Td booster every 10 years.  Varicella vaccine. An adult without evidence of immunity to varicella should receive 2 doses or a second dose if she has previously received 1 dose. Pregnant females who do not have evidence of immunity should receive the first dose after pregnancy. This first dose should be obtained before leaving the health care  facility. The second dose should be obtained 4 8 weeks after the first dose.  Human papillomavirus (HPV) vaccine. Females aged 40 26 years who have not received the vaccine previously should obtain the 3-dose series. The vaccine is not recommended for use in pregnant females. However, pregnancy testing is not needed before receiving a dose. If a female is found to be pregnant after receiving a dose, no treatment is needed. In that case, the remaining doses should be delayed until after the pregnancy. Immunization is recommended for any person with an immunocompromised condition through the age of 4 years if she did not get any or all doses earlier. During the 3-dose series, the second dose should be obtained 4 8 weeks after the first dose. The third dose should be obtained 24 weeks after the first dose and 16 weeks after the second dose.  Zoster vaccine. One dose is recommended for adults aged 30 years or older unless certain conditions are present.  Measles, mumps, and rubella (MMR) vaccine. Adults born before [birth year] generally are considered immune to measles and mumps. Adults born in [birth year] or later should have 1 or more doses of MMR vaccine unless there is a contraindication to the vaccine or there is laboratory evidence of immunity to each of the three diseases. A routine second dose of MMR vaccine should be obtained at least 28 days after the first dose for students attending postsecondary schools, health care workers, or international travelers. People who received inactivated measles vaccine or an unknown type of measles vaccine during 1963 1967 should receive 2 doses of MMR vaccine. People who  received inactivated mumps vaccine or an unknown type of mumps vaccine before 1979 and are at high risk for mumps infection should consider immunization with 2 doses of MMR vaccine. For females of childbearing age, rubella immunity should be determined. If there is no evidence of immunity, females who are not  pregnant should be vaccinated. If there is no evidence of immunity, females who are pregnant should delay immunization until after pregnancy. Unvaccinated health care workers born before [birth year] who lack laboratory evidence of measles, mumps, or rubella immunity or laboratory confirmation of disease should consider measles and mumps immunization with 2 doses of MMR vaccine or rubella immunization with 1 dose of MMR vaccine.  Pneumococcal 13-valent conjugate (PCV13) vaccine. When indicated, a person who is uncertain of her immunization history and has no record of immunization should receive the PCV13 vaccine. An adult aged 67 years or older who has certain medical conditions and has not been previously immunized should receive 1 dose of PCV13 vaccine. This PCV13 should be followed with a dose of pneumococcal polysaccharide (PPSV23) vaccine. The PPSV23 vaccine dose should be obtained at least 8 weeks after the dose of PCV13 vaccine. An adult aged 28 years or older who has certain medical conditions and previously received 1 or more doses of PPSV23 vaccine should receive 1 dose of PCV13. The PCV13 vaccine dose should be obtained 1 or more years after the last PPSV23 vaccine dose.  Pneumococcal polysaccharide (PPSV23) vaccine. When PCV13 is also indicated, PCV13 should be obtained first. All adults aged 53 years and older should be immunized. An adult younger than age 8 years who has certain medical conditions should be immunized. Any person who resides in a nursing home or long-term care facility should be immunized. An adult smoker should be immunized. People with an immunocompromised condition and certain other conditions should receive both PCV13 and PPSV23 vaccines. People with human immunodeficiency virus (HIV) infection should be immunized as soon as possible after diagnosis. Immunization during chemotherapy or radiation therapy should be avoided. Routine use of PPSV23 vaccine is not recommended for [place]  [place]ians, [place]atives, or people younger than 65 years unless there are medical conditions that require PPSV23 vaccine. When indicated, people who have unknown immunization and have no record of immunization should receive PPSV23 vaccine. One-time revaccination 5 years after the first dose of PPSV23 is recommended for people aged 66 64 years who have chronic kidney failure, nephrotic syndrome, asplenia, or immunocompromised conditions. People who received 1 2 doses of PPSV23 before age 44 years should receive another dose of PPSV23 vaccine at age 42 years or later if at least 5 years have passed since the previous dose. Doses of PPSV23 are not needed for people immunized with PPSV23 at or after age 82 years.  Meningococcal vaccine. Adults with asplenia or persistent complement component deficiencies should receive 2 doses of quadrivalent meningococcal conjugate (MenACWY-D) vaccine. The doses should be obtained at least 2 months apart. Microbiologists working with certain meningococcal bacteria, [place] recruits, people at risk during an outbreak, and people who travel to or live in countries with a high rate of meningitis should be immunized. A first-year college student up through age 63 years who is living in a residence hall should receive a dose if she did not receive a dose on or after her 16th birthday. Adults who have certain high-risk conditions should receive one or more doses of vaccine.  Hepatitis A vaccine. Adults who wish to be protected from this disease, have certain high-risk  conditions, work with hepatitis A-infected animals, work in hepatitis A research labs, or travel to or work in countries with a high rate of hepatitis A should be immunized. Adults who were previously unvaccinated and who anticipate close contact with an international adoptee during the first 60 days after arrival in the [place] from a country with a high rate of hepatitis A should be immunized.  Hepatitis B  vaccine.  Adults who wish to be protected from this disease, have certain high-risk conditions, may be exposed to blood or other infectious body fluids, are household contacts or sex partners of hepatitis B positive people, are clients or workers in certain care facilities, or travel to or work in countries with a high rate of hepatitis B should be immunized.  Haemophilus influenzae type b (Hib) vaccine. A previously unvaccinated person with asplenia or sickle cell disease or having a scheduled splenectomy should receive 1 dose of Hib vaccine. Regardless of previous immunization, a recipient of a hematopoietic stem cell transplant should receive a 3-dose series 6 12 months after her successful transplant. Hib vaccine is not recommended for adults with HIV infection.  Preventive Services / Frequency Ages 57 to 39years  Blood pressure check.** / Every 1 to 2 years.  Lipid and cholesterol check.** / Every 5 years beginning at age 69.  Clinical breast exam.** / Every 3 years for women in their 65s and 90s.  BRCA-related cancer risk assessment.** / For women who have family members with a BRCA-related cancer (breast, ovarian, tubal, or peritoneal cancers).  Pap test.** / Every 2 years from ages 26 through 31. Every 3 years starting at age 74 through age 49 or 58 with a history of 3 consecutive normal Pap tests.  HPV screening.** / Every 3 years from ages 47 through ages 14 to 26 with a history of 3 consecutive normal Pap tests.  Hepatitis C blood test.** / For any individual with known risks for hepatitis C.  Skin self-exam. / Monthly.  Influenza vaccine. / Every year.  Tetanus, diphtheria, and acellular pertussis (Tdap, Td) vaccine.** / Consult your health care provider. Pregnant women should receive 1 dose of Tdap vaccine during each pregnancy. 1 dose of Td every 10 years.  Varicella vaccine.** / Consult your health care provider. Pregnant females who do not have evidence of immunity should  receive the first dose after pregnancy.  HPV vaccine. / 3 doses over 6 months, if 67 and younger. The vaccine is not recommended for use in pregnant females. However, pregnancy testing is not needed before receiving a dose.  Measles, mumps, rubella (MMR) vaccine.** / You need at least 1 dose of MMR if you were born in 1957 or later. You may also need a 2nd dose. For females of childbearing age, rubella immunity should be determined. If there is no evidence of immunity, females who are not pregnant should be vaccinated. If there is no evidence of immunity, females who are pregnant should delay immunization until after pregnancy.  Pneumococcal 13-valent conjugate (PCV13) vaccine.** / Consult your health care provider.  Pneumococcal polysaccharide (PPSV23) vaccine.** / 1 to 2 doses if you smoke cigarettes or if you have certain conditions.  Meningococcal vaccine.** / 1 dose if you are age 9 to 45 years and a Market researcher living in a residence hall, or have one of several medical conditions, you need to get vaccinated against meningococcal disease. You may also need additional booster doses.  Hepatitis A vaccine.** / Consult your health care provider.  Hepatitis B vaccine.** / Consult your health care provider.  Haemophilus influenzae type b (Hib) vaccine.** / Consult your health care provider.  Ages 33 to 64years  Blood pressure check.** / Every 1 to 2 years.  Lipid and cholesterol check.** / Every 5 years beginning at age 70 years.  Lung cancer screening. / Every year if you are aged 23 80 years and have a 30-pack-year history of smoking and currently smoke or have quit within the past 15 years. Yearly screening is stopped once you have quit smoking for at least 15 years or develop a health problem that would prevent you from having lung cancer treatment.  Clinical breast exam.** / Every year after age 61 years.  BRCA-related cancer risk assessment.** / For women who have  family members with a BRCA-related cancer (breast, ovarian, tubal, or peritoneal cancers).  Mammogram.** / Every year beginning at age 33 years and continuing for as long as you are in good health. Consult with your health care provider.  Pap test.** / Every 3 years starting at age 17 years through age 45 or 43 years with a history of 3 consecutive normal Pap tests.  HPV screening.** / Every 3 years from ages 43 years through ages 84 to 49 years with a history of 3 consecutive normal Pap tests.  Fecal occult blood test (FOBT) of stool. / Every year beginning at age 74 years and continuing until age 25 years. You may not need to do this test if you get a colonoscopy every 10 years.  Flexible sigmoidoscopy or colonoscopy.** / Every 5 years for a flexible sigmoidoscopy or every 10 years for a colonoscopy beginning at age 47 years and continuing until age 23 years.  Hepatitis C blood test.** / For all people born from [birth year] through 1963 and any individual with known risks for hepatitis C.  Skin self-exam. / Monthly.  Influenza vaccine. / Every year.  Tetanus, diphtheria, and acellular pertussis (Tdap/Td) vaccine.** / Consult your health care provider. Pregnant women should receive 1 dose of Tdap vaccine during each pregnancy. 1 dose of Td every 10 years.  Varicella vaccine.** / Consult your health care provider. Pregnant females who do not have evidence of immunity should receive the first dose after pregnancy.  Zoster vaccine.** / 1 dose for adults aged 46 years or older.  Measles, mumps, rubella (MMR) vaccine.** / You need at least 1 dose of MMR if you were born in 1957 or later. You may also need a 2nd dose. For females of childbearing age, rubella immunity should be determined. If there is no evidence of immunity, females who are not pregnant should be vaccinated. If there is no evidence of immunity, females who are pregnant should delay immunization until after pregnancy.  Pneumococcal  13-valent conjugate (PCV13) vaccine.** / Consult your health care provider.  Pneumococcal polysaccharide (PPSV23) vaccine.** / 1 to 2 doses if you smoke cigarettes or if you have certain conditions.  Meningococcal vaccine.** / Consult your health care provider.  Hepatitis A vaccine.** / Consult your health care provider.  Hepatitis B vaccine.** / Consult your health care provider.  Haemophilus influenzae type b (Hib) vaccine.** / Consult your health care provider.  Ages 102 years and over  Blood pressure check.** / Every 1 to 2 years.  Lipid and cholesterol check.** / Every 5 years beginning at age 82 years.  Lung cancer screening. / Every year if you are aged 63 80 years and have a 30-pack-year history of smoking and currently smoke  or have quit within the past 15 years. Yearly screening is stopped once you have quit smoking for at least 15 years or develop a health problem that would prevent you from having lung cancer treatment.  Clinical breast exam.** / Every year after age 92 years.  BRCA-related cancer risk assessment.** / For women who have family members with a BRCA-related cancer (breast, ovarian, tubal, or peritoneal cancers).  Mammogram.** / Every year beginning at age 62 years and continuing for as long as you are in good health. Consult with your health care provider.  Pap test.** / Every 3 years starting at age 30 years through age 35 or 17 years with 3 consecutive normal Pap tests. Testing can be stopped between 65 and 70 years with 3 consecutive normal Pap tests and no abnormal Pap or HPV tests in the past 10 years.  HPV screening.** / Every 3 years from ages 78 years through ages 67 or 20 years with a history of 3 consecutive normal Pap tests. Testing can be stopped between 65 and 70 years with 3 consecutive normal Pap tests and no abnormal Pap or HPV tests in the past 10 years.  Fecal occult blood test (FOBT) of stool. / Every year beginning at age 77 years and  continuing until age 44 years. You may not need to do this test if you get a colonoscopy every 10 years.  Flexible sigmoidoscopy or colonoscopy.** / Every 5 years for a flexible sigmoidoscopy or every 10 years for a colonoscopy beginning at age 60 years and continuing until age 43 years.  Hepatitis C blood test.** / For all people born from [birth year] through 1963 and any individual with known risks for hepatitis C.  Osteoporosis screening.** / A one-time screening for women ages 72 years and over and women at risk for fractures or osteoporosis.  Skin self-exam. / Monthly.  Influenza vaccine. / Every year.  Tetanus, diphtheria, and acellular pertussis (Tdap/Td) vaccine.** / 1 dose of Td every 10 years.  Varicella vaccine.** / Consult your health care provider.  Zoster vaccine.** / 1 dose for adults aged 4 years or older.  Pneumococcal 13-valent conjugate (PCV13) vaccine.** / Consult your health care provider.  Pneumococcal polysaccharide (PPSV23) vaccine.** / 1 dose for all adults aged 51 years and older.  Meningococcal vaccine.** / Consult your health care provider.  Hepatitis A vaccine.** / Consult your health care provider.  Hepatitis B vaccine.** / Consult your health care provider.  Haemophilus influenzae type b (Hib) vaccine.** / Consult your health care provider. ** Family history and personal history of risk and conditions may change your health care provider's recommendations. Document Released: 09/03/2001 Document Revised: 04/28/2013  [facility] Patient Information 2014 [place], [place].   EXERCISE AND DIET:  We recommended that you start or continue a regular exercise program for good health. Regular exercise means any activity that makes your heart beat faster and makes you sweat.  We recommend exercising at least 30 minutes per day at least 3 days a week, preferably 5.  We also recommend a diet low in fat and sugar / carbohydrates.  Inactivity, poor dietary choices and obesity  can cause diabetes, heart attack, stroke, and kidney damage, among others.     ALCOHOL AND SMOKING:  Women should limit their alcohol intake to no more than 7 drinks/beers/glasses of wine (combined, not each!) per week. Moderation of alcohol intake to this level decreases your risk of breast cancer and liver damage.  ( And of course, no recreational drugs  are part of a healthy lifestyle.)  Also, you should not be smoking at all or even being exposed to second hand smoke. Most people know smoking can cause cancer, and various heart and lung diseases, but did you know it also contributes to weakening of your bones?  Aging of your skin?  Yellowing of your teeth and nails?   CALCIUM AND VITAMIN D:  Adequate intake of calcium and Vitamin D are recommended.  The recommendations for exact amounts of these supplements seem to change often, but generally speaking 600 mg of calcium (either carbonate or citrate) and 800 units of Vitamin D per day seems prudent. Certain women may benefit from higher intake of Vitamin D.  If you are among these women, your doctor will have told you during your visit.     PAP SMEARS:  Pap smears, to check for cervical cancer or precancers,  have traditionally been done yearly, although recent scientific advances have shown that most women can have pap smears less often.  However, every woman still should have a physical exam from her gynecologist or primary care physician every year. It will include a breast check, inspection of the vulva and vagina to check for abnormal growths or skin changes, a visual exam of the cervix, and then an exam to evaluate the size and shape of the uterus and ovaries.  And after 40 years of age, a rectal exam is indicated to check for rectal cancers. We will also provide age appropriate advice regarding health maintenance, like when you should have certain vaccines, screening for sexually transmitted diseases, bone density testing, colonoscopy,  mammograms, etc.    MAMMOGRAMS:  All women over 36 years old should have a yearly mammogram. Many facilities now offer a "3D" mammogram, which may cost around $50 extra out of pocket. If possible,  we recommend you accept the option to have the 3D mammogram performed.  It both reduces the number of women who will be called back for extra views which then turn out to be normal, and it is better than the routine mammogram at detecting truly abnormal areas.     COLONOSCOPY:  Colonoscopy to screen for colon cancer is recommended for all women at age 14.  We know, you hate the idea of the prep.  We agree, BUT, having colon cancer and not knowing it is worse!!  Colon cancer so often starts as a polyp that can be seen and removed at colonscopy, which can quite literally save your life!  And if your first colonoscopy is normal and you have no family history of colon cancer, most women don't have to have it again for 10 years.  Once every ten years, you can do something that may end up saving your life, right?  We will be happy to help you get it scheduled when you are ready.  Be sure to check your insurance coverage so you understand how much it will cost.  It may be covered as a preventative service at no cost, but you should check your particular policy.

## 2018-01-20 NOTE — Progress Notes (Signed)
Impression and Recommendations:    1. Encounter for general adult medical examination with abnormal findings   2. Health education/counseling   3. Obesity (BMI 30.0-34.9)   4. Environmental and seasonal allergies   5. Weight loss counseling, encounter for   6. Chronic idiopathic constipation   7. Dehydration, mild   8. Vertigo/dizziness   9. Insomnia, unspecified type   10. Allergic state, sequela   11. Adjustment insomnia   12. Vitamin D deficiency, with symptoms    -Patient had many additional questions and concerns in addition to her health maintenance evaluation and physical today.  Pt was in the office today for 32.5+ minutes, with over 50% time spent in face to face counseling of patients various medical conditions, treatment plans of those medical conditions including medicine management and lifestyle modification, strategies to improve health and well being; and in coordination of care. SEE ABOVE TREATMENT PLAN FOR DETAILS  1) Anticipatory Guidance: Discussed importance of wearing a seatbelt while driving, not texting while driving; sunscreen when outside along with yearly skin surveillance; eating a well balanced and modest diet; physical activity at least 25 minutes per day or 150 min/ week of moderate to intense activity.  - Continue wearing sunscreen regularly.  - Continue following up with OBGYN for help with conceiving.  Advised patient to keep track of her ovulation to help.  2) Immunizations / Screenings / Labs:  All immunizations and screenings that patient agrees to, are up-to-date per recommendations or will be updated today.  Patient understands the needs for q 45mo dental and yearly vision screens which pt will schedule independently. Obtain CBC, CMP, HgA1c, Lipid panel, TSH and vit D when fasting if not already done recently.   Dermatological Concerns - Referral to dermatology recommended today.  Advised patient to keep an eye on her moles or any other spots  she feels concerned about.  Watch for new growth or changes, or new lesions  3) Weight:   Discussed goal of losing even 5-10% of current body weight which would improve overall feelings of well being and improve objective health data significantly.   Improve nutrient density of diet through increasing intake of fruits and vegetables and decreasing saturated/trans fats, white flour products and refined sugar products.   -Recommend patient go to weight watchers since this worked well for her in the past and we discussed various pluses\minuses of that program versus Keto versus others  - Referral to Nutritionist placed today.  BMI Counseling Explained to patient what BMI refers to, and what it means medically.    Told patient to think about it as a "medical risk stratification measurement" and how increasing BMI is associated with increasing risk/ or worsening state of various diseases such as hypertension, hyperlipidemia, diabetes, premature OA, depression etc.  American Heart Association guidelines for healthy diet, basically Mediterranean diet, and exercise guidelines of 30 minutes 5 days per week or more discussed in detail.  Health counseling performed.  All questions answered.  4) Lifestyle & Preventative Health Maintenance - Advised patient to continue working toward exercising to improve health.    -  Strive for daily physical activity.  Recommended that the patient eventually strive for at least 150 minutes of moderate cardiovascular activity per week according to guidelines established by the [facility].   - Healthy dietary habits encouraged, including low-carb, and high amounts of lean protein in diet.   - Encouraged patient to resume a program like Weight Watchers, eating healthy foods in moderation, eating  enough to keep her metabolism happy, but not too much. 6. Chronic idiopathic constipation  -We discussed bowel care and general approach towards constipation to include adequate  hydration, use of daily MiraLAX, various foods and their effects on the body causing constipation excess fiber for instance or how she is can cause irritable bowel etc.  7. Dehydration, mild -Patient feels that she has had very little water intake and likely the reason for many of her symptoms.  We discussed one half of her weight in ounces of water per day is adequate plus drinking 1 extra bottle water for every 30 minutes of moderate intensity exercise.  8. Vertigo/dizziness -Reassured patient that she did not have any red flag symptoms -Discussed preventative strategies to decrease frequency. -Patient declined further eval at this time and will let me know if it becomes more frequent and increases in severity. -Discussed eustachian tube dysfunction and chronic sinusitis as etiology and also discussed treatment for these issues such as over-the-counter Allegra, prescription Flonase as well as sinus rinses etc.  9. Insomnia, unspecified type  -Sleep hygiene discussed. -Refill of Ambien after discussion of risk benefits of meds.  10. Allergic state, sequela  -Patient went for allergy testing and I asked her to please send [place] the medical records from her allergist. -Discussed strategies to prevent exposure including and 95 mask, frequent sinus flushings etc.  11. Vitamin D deficiency, with symptoms  -Discussed supplementation and adequate levels. -Pt needs to get more consistent with therapeutic plan of care   - Patient should also consume adequate amounts of water - half of body weight in oz of water per day. - Advised that continued cardio and increased water intake/hydration will help her bowel movements. - Patient will begin Miralax every day to help with her constipation  5) Follow-Up - Last labs were drawn on 01/03/2017.  Patient is due for blood work but not fasting today. - Patient will return at her convenience while fasting to have lab work drawn.  - Return in 2 months for chronic  follow-up to check on nutrition, dehydration, vertigo, constipation, exercise, progress with weight loss, and progress with conceiving.  - Document when you feel dizzy, if you ate or not beforehand, and monitor blood pressure when it occurs.  If dizziness worsens, call up to see [place] sooner.  Meds ordered this encounter  Medications  . zolpidem (AMBIEN CR) 12.5 MG CR tablet    Sig: Take 1 tablet (12.5 mg total) by mouth at bedtime as needed for sleep.    Dispense:  30 tablet    Refill:  0    Orders Placed This Encounter  Procedures  . CBC with Differential/Platelet  . Comprehensive metabolic panel  . Hemoglobin A1c  . Lipid panel  . Vitamin B12  . TSH  . T4, free  . VITAMIN D 25 Hydroxy (Vit-D Deficiency, Fractures)  . Amb Referral to Nutrition and Diabetic E    Gross side effects, risk and benefits, and alternatives of medications discussed with patient.  Patient is aware that all medications have potential side effects and we are unable to predict every side effect or drug-drug interaction that may occur.  Expresses verbal understanding and consents to current therapy plan and treatment regimen.  F-up preventative CPE in 1 year. F/up sooner for chronic care management as discussed and/or prn.  Please see orders placed and AVS handed out to patient at the end of our visit for further patient instructions/ counseling done pertaining to today's office  visit.  This document serves as a record of services personally performed by Mellody Dance, DO. It was created on her behalf by Toni Amend, a trained medical scribe. The creation of this record is based on the scribe's personal observations and the provider's statements to them.   I have reviewed the above medical documentation for accuracy and completeness and I concur.  Mellody Dance 01/21/18 12:45 PM    Subjective:    Chief Complaint  Patient presents with  . Annual Exam   CC:   HPI: Katelyn Wright is a 42 y.o.  female who presents to [place] at [facility] today a yearly health maintenance exam.  Health Maintenance Summary Reviewed and updated, unless pt declines services.  Notes mother has diabetes now; she's 62, overweight, unhealthy, smokes cigarettes, etc. Dad now has high blood pressure, and he is age 1.  Tobacco History Reviewed:   Y; non-smoker. Alcohol:    No concerns, no excessive use Exercise Habits:   Not meeting AHA guidelines. STD concerns:   none Drug Use:   None Birth control method:   n/a Menses regular:     n/a Lumps or breast concerns:   No; was evaluated recently for breast concerns (December 2018). Starting at age 82, she's returning for follow-up every year. Breast Cancer Family History:      No  Denies digestive issues, but notes "I can go a lot of days without pooping."  It's been that way since the birth of her child three years ago.  She was told she has low mobility; was given a medicine at the time that didn't work.  Her PCP in [place] was a D.O., and manipulated her at the time, which helped.  Overall, patient states that she's good, feels good.   She was feeling nervous about coming to her appointment because she knew her weight was up.  She is exercising 3-4 days weekly, for an hour each time she exercises.  She notes it's mainly cardio, but she needs to begin incorporating weights.  States "I married an Physiological scientist."  Drinks usually around "8 glasses to a gallon" of water.  Dizzy Spells (Dehydration) Patient notes having some dizzy spells when she turns her head really fast; sometimes feels like things are moving slowly. She believes this is due to dehydration.  Feels that sometimes, when she 'shifts her eyes,' she gets dizzy.  They went to [employer] in [place] and notes that she was very dehydrated.  The dizziness started after this.  Weight Concerns Notes that she's unhappy with her weight.  Her weight is up and  down. Notes that she's on the up side today, "since the first of the year."  Started the keto diet; notes that it helped resolve a lot of chronic pains. Has been doing intermittent fasting and was eating only one meal a day last week.  She has not been losing weight.  In the past, Weight Watchers worked for her.  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  Dermatology Patient is wearing sunscreen religiously.  Mole on her right breast that has concerned her - she notes that she may have accidentally scratched it. Notes a mole on her left upper belly as well.  Feels concerned about this mole due to changes that have occurred over the past year.  Patient plans to follow up with dermatology about this.  OBGYN Her OBGYN used to be Dr. Terri Piedra; now seeing a new provider. Had her annual exam back in March; office in the [facility] building. Everything was fine during her annual in March 2019.  Beginning Attempt to Conceive Patient is trying to have a kid this year; "if after December it ain't poppin, it ain't poppin." Feels that she's having more difficulty this time because of the weirdness of her periods; her period is very irregular lately.  States that she thinks this is diet related.   Immunization History  Administered Date(s) Administered  . Tdap 10/04/2014    Health Maintenance  Topic Date Due  . PAP SMEAR  12/30/2016  . HIV Screening  01/10/2029 (Originally [birth year]1993)  . INFLUENZA VACCINE  02/19/2018  . TETANUS/TDAP  10/03/2024     Wt Readings from Last 3 Encounters:  01/20/18 201 lb 9.6 oz (91.4 kg)  12/11/17 197 lb (89.4 kg)  11/18/17 197 lb 1.6 oz (89.4 kg)   BP Readings from Last 3 Encounters:  01/20/18 125/85  12/11/17 137/88  11/18/17 (!) 136/92   Pulse Readings from Last 3 Encounters:  01/20/18 68  12/11/17 69   11/18/17 63     Past Medical History:  Diagnosis Date  . Eczema   . Heart murmur   . Seasonal allergies       Past Surgical History:  Procedure Laterality Date  . ADENOIDECTOMY    . CYSTOSCOPY WITH URETEROSCOPY AND STENT PLACEMENT Right 09/29/2015   Procedure: RIGHT  URETEROSCOPY AND STENT PLACEMENT, RETROGRADE;  Surgeon: Kathie Rhodes, MD;  Location: [facility];  Service: Urology;  Laterality: Right;  . HOLMIUM LASER APPLICATION Right 0/99/8338   Procedure: WITH HOLMIUM LASER;  Surgeon: Kathie Rhodes, MD;  Location: [facility];  Service: Urology;  Laterality: Right;  . right stent placed     Right Renal Stent  . TONSILLECTOMY AND ADENOIDECTOMY  1994      Family History  Problem Relation Age of Onset  . Arthritis Mother   . Hypertension Mother   . Diabetes Mother   . Hypertension Father   . Alcohol abuse Maternal Uncle   . Heart disease Maternal Grandfather   . Breast cancer Paternal Grandmother       Social History   Substance and Sexual Activity  Drug Use No  ,   Social History   Substance and Sexual Activity  Alcohol Use No  ,   Social History   Tobacco Use  Smoking Status Never Smoker  Smokeless Tobacco Never Used  ,   Social History   Substance and Sexual Activity  Sexual Activity Yes  . Birth control/protection: None    Current Outpatient Medications on File Prior to Visit  Medication Sig Dispense Refill  . Cholecalciferol (VITAMIN D3) 5000 units TABS 5,000 IU OTC vitamin D3 daily. 90 tablet 3  . Crisaborole (EUCRISA) 2 % OINT Apply 1 application topically 2 (two) times daily. To inflamed, itchy areas during flares 60 g 5  . Prenatal Vit-Fe Fumarate-FA (MULTIVITAMIN-PRENATAL) 27-0.8 MG TABS tablet Take 1 tablet by mouth daily at 12 noon.    . Probiotic Product (PROBIOTIC ADVANCED PO) Take 1 capsule by mouth daily.    . Vitamin D, Ergocalciferol, (DRISDOL) 50000 units CAPS capsule Take 1 capsule (50,000 Units total) by mouth every 7 (seven) days. 12  capsule 10   No current facility-administered medications on file prior to visit.     Allergies: Cefdinir  Review of Systems: General:   Denies fever, chills, unexplained weight loss.  Optho/Auditory:   Denies visual changes, blurred vision/LOV Respiratory:   Denies SOB, DOE more than baseline levels.  Cardiovascular:   Denies chest pain, palpitations, new onset peripheral edema  Gastrointestinal:   Denies nausea, vomiting, diarrhea.  Genitourinary: Denies dysuria, freq/ urgency, flank pain or discharge from genitals.  Endocrine:     Denies hot or cold intolerance, polyuria, polydipsia. Musculoskeletal:   Denies unexplained myalgias, joint swelling, unexplained arthralgias, gait problems.  Skin:  Denies rash, suspicious lesions Neurological:     Denies dizziness, unexplained weakness, numbness  Psychiatric/Behavioral:   Denies mood changes, suicidal or homicidal ideations, hallucinations    Objective:    Blood pressure 125/85, pulse 68, height 5\' 4"  (1.626 m), weight 201 lb 9.6 oz (91.4 kg), last menstrual period 01/18/2018, SpO2 99 %. Body mass index is 34.6 kg/m. General Appearance:    Alert, cooperative, no distress, appears stated age  Head:    Normocephalic, without obvious abnormality, atraumatic  Eyes:    PERRL, conjunctiva/corneas clear, EOM's intact, fundi    benign, both eyes  Ears:    Normal TM's and external ear canals, both ears  Nose:   Nares normal, septum midline, mucosa normal, no drainage    or sinus tenderness  Throat:   Lips w/o lesion, mucosa moist, and tongue normal; teeth and   gums normal  Neck:   Supple, symmetrical, trachea midline, no adenopathy;    thyroid:  no enlargement/tenderness/nodules; no carotid   bruit or JVD  Back:     Symmetric, no curvature, ROM normal, no CVA tenderness  Lungs:     Clear to auscultation bilaterally, respirations unlabored, no       Wh/ R/ R  Chest Wall:    No tenderness or gross deformity; normal excursion   Heart:     Regular rate and rhythm, S1 and S2 normal, no murmur, rub   or gallop  Breast Exam:   Not performed.  Abdomen:     Soft, non-tender, bowel sounds active all four quadrants, NO   G/R/R, no masses, no organomegaly  Genitalia:   Not performed.  Rectal:   Not performed.  Extremities:   Extremities normal, atraumatic, no cyanosis or gross edema  Pulses:   2+ and symmetric all extremities  Skin:   Warm, dry, Skin color, texture, turgor normal, no obvious rashes or lesions Psych: No HI/SI, judgement and insight good, Euthymic mood. Full Affect.  Neurologic:   CNII-XII intact, normal strength, sensation and reflexes    Throughout

## 2018-01-27 ENCOUNTER — Other Ambulatory Visit (INDEPENDENT_AMBULATORY_CARE_PROVIDER_SITE_OTHER): Payer: BC Managed Care – PPO

## 2018-01-27 DIAGNOSIS — E559 Vitamin D deficiency, unspecified: Secondary | ICD-10-CM

## 2018-01-27 DIAGNOSIS — K5904 Chronic idiopathic constipation: Secondary | ICD-10-CM

## 2018-01-27 DIAGNOSIS — E669 Obesity, unspecified: Secondary | ICD-10-CM

## 2018-01-27 DIAGNOSIS — R42 Dizziness and giddiness: Secondary | ICD-10-CM

## 2018-01-27 DIAGNOSIS — F5102 Adjustment insomnia: Secondary | ICD-10-CM

## 2018-01-27 DIAGNOSIS — Z713 Dietary counseling and surveillance: Secondary | ICD-10-CM

## 2018-01-27 DIAGNOSIS — E860 Dehydration: Secondary | ICD-10-CM

## 2018-01-27 DIAGNOSIS — Z0001 Encounter for general adult medical examination with abnormal findings: Secondary | ICD-10-CM

## 2018-01-28 LAB — T4, FREE: FREE T4: 1.4 ng/dL (ref 0.82–1.77)

## 2018-01-28 LAB — COMPREHENSIVE METABOLIC PANEL
ALT: 14 IU/L (ref 0–32)
AST: 15 IU/L (ref 0–40)
Albumin/Globulin Ratio: 1.7 (ref 1.2–2.2)
Albumin: 4.3 g/dL (ref 3.5–5.5)
Alkaline Phosphatase: 76 IU/L (ref 39–117)
BILIRUBIN TOTAL: 0.4 mg/dL (ref 0.0–1.2)
BUN/Creatinine Ratio: 16 (ref 9–23)
BUN: 11 mg/dL (ref 6–20)
CALCIUM: 9.4 mg/dL (ref 8.7–10.2)
CHLORIDE: 105 mmol/L (ref 96–106)
CO2: 24 mmol/L (ref 20–29)
Creatinine, Ser: 0.7 mg/dL (ref 0.57–1.00)
GFR calc non Af Amer: 109 mL/min/{1.73_m2} (ref >59)
GFR, EST AFRICAN AMERICAN: 126 mL/min/{1.73_m2} (ref >59)
GLUCOSE: 89 mg/dL (ref 65–99)
Globulin, Total: 2.5 g/dL (ref 1.5–4.5)
Potassium: 4.2 mmol/L (ref 3.5–5.2)
Sodium: 143 mmol/L (ref 134–144)
TOTAL PROTEIN: 6.8 g/dL (ref 6.0–8.5)

## 2018-01-28 LAB — LIPID PANEL
CHOLESTEROL TOTAL: 208 mg/dL — AB (ref 100–199)
Chol/HDL Ratio: 4.4 ratio (ref 0.0–4.4)
HDL: 47 mg/dL (ref >39)
LDL CALC: 132 mg/dL — AB (ref 0–99)
Triglycerides: 146 mg/dL (ref 0–149)
VLDL Cholesterol Cal: 29 mg/dL (ref 5–40)

## 2018-01-28 LAB — HEMOGLOBIN A1C
ESTIMATED AVERAGE GLUCOSE: 105 mg/dL
HEMOGLOBIN A1C: 5.3 % (ref 4.8–5.6)

## 2018-01-28 LAB — CBC WITH DIFFERENTIAL/PLATELET
BASOS ABS: 0.1 10*3/uL (ref 0.0–0.2)
Basos: 1 %
EOS (ABSOLUTE): 0.2 10*3/uL (ref 0.0–0.4)
Eos: 5 %
HEMOGLOBIN: 12.7 g/dL (ref 11.1–15.9)
Hematocrit: 38.4 % (ref 34.0–46.6)
IMMATURE GRANULOCYTES: 0 %
Immature Grans (Abs): 0 10*3/uL (ref 0.0–0.1)
Lymphocytes Absolute: 1.6 10*3/uL (ref 0.7–3.1)
Lymphs: 31 %
MCH: 28.6 pg (ref 26.6–33.0)
MCHC: 33.1 g/dL (ref 31.5–35.7)
MCV: 87 fL (ref 79–97)
MONOCYTES: 7 %
Monocytes Absolute: 0.4 10*3/uL (ref 0.1–0.9)
NEUTROS ABS: 2.9 10*3/uL (ref 1.4–7.0)
Neutrophils: 56 %
PLATELETS: 239 10*3/uL (ref 150–450)
RBC: 4.44 x10E6/uL (ref 3.77–5.28)
RDW: 13.7 % (ref 12.3–15.4)
WBC: 5.2 10*3/uL (ref 3.4–10.8)

## 2018-01-28 LAB — TSH: TSH: 1.47 u[IU]/mL (ref 0.450–4.500)

## 2018-01-28 LAB — VITAMIN B12: VITAMIN B 12: 697 pg/mL (ref 232–1245)

## 2018-01-28 LAB — VITAMIN D 25 HYDROXY (VIT D DEFICIENCY, FRACTURES): VIT D 25 HYDROXY: 32.3 ng/mL (ref 30.0–100.0)

## 2018-02-28 ENCOUNTER — Other Ambulatory Visit: Payer: Self-pay | Admitting: Family Medicine

## 2018-02-28 DIAGNOSIS — E559 Vitamin D deficiency, unspecified: Secondary | ICD-10-CM

## 2018-03-24 ENCOUNTER — Ambulatory Visit: Payer: BC Managed Care – PPO | Admitting: Family Medicine

## 2018-12-16 ENCOUNTER — Encounter: Payer: Self-pay | Admitting: Family Medicine

## 2018-12-22 NOTE — Telephone Encounter (Signed)
This was already responded to and patient notified. MPulliam, CMA/RT(R)

## 2019-03-22 ENCOUNTER — Other Ambulatory Visit: Payer: Self-pay

## 2019-03-22 DIAGNOSIS — E559 Vitamin D deficiency, unspecified: Secondary | ICD-10-CM

## 2019-07-07 ENCOUNTER — Encounter: Payer: Self-pay | Admitting: Family Medicine

## 2019-07-13 ENCOUNTER — Other Ambulatory Visit: Payer: Self-pay

## 2019-07-13 ENCOUNTER — Other Ambulatory Visit: Payer: BC Managed Care – PPO

## 2019-07-13 DIAGNOSIS — Z0000 Encounter for general adult medical examination without abnormal findings: Secondary | ICD-10-CM

## 2019-07-13 DIAGNOSIS — E559 Vitamin D deficiency, unspecified: Secondary | ICD-10-CM

## 2019-07-14 LAB — VITAMIN D 25 HYDROXY (VIT D DEFICIENCY, FRACTURES): Vit D, 25-Hydroxy: 27.4 ng/mL — ABNORMAL LOW (ref 30.0–100.0)

## 2019-07-14 LAB — CBC WITH DIFFERENTIAL/PLATELET
Basophils Absolute: 0.1 10*3/uL (ref 0.0–0.2)
Basos: 1 %
EOS (ABSOLUTE): 0.3 10*3/uL (ref 0.0–0.4)
Eos: 6 %
Hematocrit: 38.6 % (ref 34.0–46.6)
Hemoglobin: 13 g/dL (ref 11.1–15.9)
Immature Grans (Abs): 0 10*3/uL (ref 0.0–0.1)
Immature Granulocytes: 0 %
Lymphocytes Absolute: 1.9 10*3/uL (ref 0.7–3.1)
Lymphs: 34 %
MCH: 28.6 pg (ref 26.6–33.0)
MCHC: 33.7 g/dL (ref 31.5–35.7)
MCV: 85 fL (ref 79–97)
Monocytes Absolute: 0.5 10*3/uL (ref 0.1–0.9)
Monocytes: 9 %
Neutrophils Absolute: 2.9 10*3/uL (ref 1.4–7.0)
Neutrophils: 50 %
Platelets: 211 10*3/uL (ref 150–450)
RBC: 4.55 x10E6/uL (ref 3.77–5.28)
RDW: 14.2 % (ref 11.7–15.4)
WBC: 5.7 10*3/uL (ref 3.4–10.8)

## 2019-07-14 LAB — COMPREHENSIVE METABOLIC PANEL
ALT: 16 IU/L (ref 0–32)
AST: 17 IU/L (ref 0–40)
Albumin/Globulin Ratio: 1.7 (ref 1.2–2.2)
Albumin: 4.5 g/dL (ref 3.8–4.8)
Alkaline Phosphatase: 88 IU/L (ref 39–117)
BUN/Creatinine Ratio: 15 (ref 9–23)
BUN: 12 mg/dL (ref 6–24)
Bilirubin Total: 0.3 mg/dL (ref 0.0–1.2)
CO2: 25 mmol/L (ref 20–29)
Calcium: 9.9 mg/dL (ref 8.7–10.2)
Chloride: 103 mmol/L (ref 96–106)
Creatinine, Ser: 0.82 mg/dL (ref 0.57–1.00)
GFR calc Af Amer: 103 mL/min/{1.73_m2} (ref >59)
GFR calc non Af Amer: 89 mL/min/{1.73_m2} (ref >59)
Globulin, Total: 2.7 g/dL (ref 1.5–4.5)
Glucose: 87 mg/dL (ref 65–99)
Potassium: 3.7 mmol/L (ref 3.5–5.2)
Sodium: 140 mmol/L (ref 134–144)
Total Protein: 7.2 g/dL (ref 6.0–8.5)

## 2019-07-14 LAB — LIPID PANEL
Chol/HDL Ratio: 3.6 ratio (ref 0.0–4.4)
Cholesterol, Total: 197 mg/dL (ref 100–199)
HDL: 54 mg/dL (ref >39)
LDL Chol Calc (NIH): 122 mg/dL — ABNORMAL HIGH (ref 0–99)
Triglycerides: 117 mg/dL (ref 0–149)
VLDL Cholesterol Cal: 21 mg/dL (ref 5–40)

## 2019-07-14 LAB — HEMOGLOBIN A1C
Est. average glucose Bld gHb Est-mCnc: 111 mg/dL
Hgb A1c MFr Bld: 5.5 % (ref 4.8–5.6)

## 2019-07-14 LAB — T4, FREE: Free T4: 1.44 ng/dL (ref 0.82–1.77)

## 2019-07-14 LAB — T3: T3, Total: 129 ng/dL (ref 71–180)

## 2019-07-14 LAB — TSH: TSH: 2.03 u[IU]/mL (ref 0.450–4.500)

## 2019-07-19 ENCOUNTER — Ambulatory Visit (INDEPENDENT_AMBULATORY_CARE_PROVIDER_SITE_OTHER): Payer: BC Managed Care – PPO | Admitting: Family Medicine

## 2019-07-19 ENCOUNTER — Other Ambulatory Visit: Payer: Self-pay

## 2019-07-19 ENCOUNTER — Encounter: Payer: Self-pay | Admitting: Family Medicine

## 2019-07-19 VITALS — BP 120/80 | Ht 63.0 in | Wt 197.0 lb

## 2019-07-19 DIAGNOSIS — G4700 Insomnia, unspecified: Secondary | ICD-10-CM

## 2019-07-19 DIAGNOSIS — N200 Calculus of kidney: Secondary | ICD-10-CM

## 2019-07-19 DIAGNOSIS — E7841 Elevated Lipoprotein(a): Secondary | ICD-10-CM | POA: Diagnosis not present

## 2019-07-19 DIAGNOSIS — E559 Vitamin D deficiency, unspecified: Secondary | ICD-10-CM | POA: Diagnosis not present

## 2019-07-19 DIAGNOSIS — E669 Obesity, unspecified: Secondary | ICD-10-CM | POA: Diagnosis not present

## 2019-07-19 MED ORDER — VITAMIN D (ERGOCALCIFEROL) 1.25 MG (50000 UNIT) PO CAPS: 50000.0000 [IU] | ORAL_CAPSULE | ORAL | 7 refills | Status: AC

## 2019-07-19 NOTE — Progress Notes (Signed)
Virtual / live video office visit note for [employer], D.O- Primary Care Physician at [facility]   I connected with current patient today and beyond visually recognizing the correct individual, I verified that I am speaking with the correct person using two identifiers.  . Location of the patient: Home . Location of the provider: Office Only the patient (+/- their family members at pt's discretion) and myself were participating in the encounter    - This visit type was conducted due to national recommendations for restrictions regarding the COVID-19 Pandemic (e.g. social distancing) in an effort to limit this patient's exposure and mitigate transmission in our community.  This format is felt to be most appropriate for this patient at this time.   - The patient did have access to video technology today  - No physical exam could be performed with this format, beyond that communicated to [place] by the patient/ family members as noted.   - Additionally my office staff/ schedulers discussed with the patient that there may be a monetary charge related to this service, depending on patient's medical insurance.   The patient expressed understanding, and agreed to proceed.      History of Present Illness:    I, Toni Amend, am serving as scribe for Dr. Mellody Dance.   Notes she's had a lot happen between now and her last appointment (July of 2019).   - R-Sided Nephrolithiasis Had a few episodes of kidney stones; is followed by urology.  Obtained care through [facility] but cannot remember the name of her provider.  When memory is jogged, recalls Dr. Karsten Ro, and states she was mainly seeing NP named Salley Slaughter.  Following up in January for another kidney assessment.  Notes she hasn't had any issues with these symptoms since July.  Notes her stones are combination, but she tries to stay away from high oxalate foods.   Notes she avoids eating calcium and dairy.  Notes "I've  already had that surgery and I'm not doing it again, so I just let them pass."  She treats with hydration and pain medications as prescribed by specialist.   - Dieting & Exercise Has followed keto diet in the past.  She is attempting to lose weight.  Notes she's been through a lot of life stress lately "with illness around me and everything like that."  Says she is currently trying to "hit the reset button" to start the process over again.   Notes her husband is an Engineer, structural.  He does research and his research lab includes a gym.  He had myocarditis in February which they believe was COVID-related, though testing was not being done in February.  Notes his rehab from that was long and he's been released to go back to the gym.   - Hydration Notes she typically tries to drink 8 glasses of water per day.  Used to drink a lot of black tea and green tea, and no longer drinks tea anymore.  She has a cup or a half cup of coffee per day.    - Vitamin D Deficiency Is taking 5000 IU's daily.  Started one week ago  She ran out of her prescription Vitamin D in August, which she was taking prior.  Notes she "stopped taking everything" in August.  When she is not taking Vitamin D, states she feels lower energy.  "I hate to use the word depression, because it's such a heavy word, but I am a  little lower than usual."   Says she can't necessarily attribute her mood changes entirely to Vitamin D, but "I have been able to tell a significant difference in my energy levels since I ran out."  PHQ stable below.    Depression screen [facility] 2/9 07/19/2019 01/20/2018 12/11/2017 11/18/2017 01/10/2017  Decreased Interest 0 0 0 0 0  Down, Depressed, Hopeless 0 0 0 0 0  PHQ - 2 Score 0 0 0 0 0  Altered sleeping 0 0 1 1 -  Tired, decreased energy 1 0 1 1 -  Change in appetite 3 3 1 2  -  Feeling bad or failure about yourself  0 0 0 0 -  Trouble concentrating 0 0 0 0 -  Moving slowly or fidgety/restless 0 0 0 0 -   Suicidal thoughts 0 0 0 0 -  PHQ-9 Score 4 3 3 4  -  Difficult doing work/chores Not difficult at all Somewhat difficult - Not difficult at all -    GAD 7 : Generalized Anxiety Score 07/19/2019  Nervous, Anxious, on Edge 0  Control/stop worrying 0  Worry too much - different things 0  Trouble relaxing 0  Restless 0  Easily annoyed or irritable 0  Afraid - awful might happen 0  Total GAD 7 Score 0   Recent Results (from the past 2160 hour(s))  T3     Status: None   Collection Time: 07/13/19 10:00 AM  Result Value Ref Range   T3, Total 129 71 - 180 ng/dL  T4, free     Status: None   Collection Time: 07/13/19 10:00 AM  Result Value Ref Range   Free T4 1.44 0.82 - 1.77 ng/dL  VITAMIN D 25 Hydroxy (Vit-D Deficiency, Fractures)     Status: Abnormal   Collection Time: 07/13/19 10:00 AM  Result Value Ref Range   Vit D, 25-Hydroxy 27.4 (L) 30.0 - 100.0 ng/mL    Comment: Vitamin D deficiency has been defined by the [place] and an Endocrine Society practice guideline as a level of serum 25-OH vitamin D less than 20 ng/mL (1,2). The Endocrine Society went on to further define vitamin D insufficiency as a level between 21 and 29 ng/mL (2). 1. IOM (Institute of Medicine). 2010. Dietary reference    intakes for calcium and D. [place]: The    [employer]. 2. Holick MF, B[place]ley [place], Bischoff-Ferrari HA, et al.    Evaluation, treatment, and prevention of vitamin D    deficiency: an Endocrine Society clinical practice    guideline. JCEM. 2011 Jul; 96(7):1911-30.   TSH     Status: None   Collection Time: 07/13/19 10:00 AM  Result Value Ref Range   TSH 2.030 0.450 - 4.500 uIU/mL  Lipid panel     Status: Abnormal   Collection Time: 07/13/19 10:00 AM  Result Value Ref Range   Cholesterol, Total 197 100 - 199 mg/dL   Triglycerides 117 0 - 149 mg/dL   HDL 54 >39 mg/dL   VLDL Cholesterol Cal 21 5 - 40 mg/dL   LDL Chol Calc (NIH) 122 (H) 0 - 99 mg/dL    Chol/HDL Ratio 3.6 0.0 - 4.4 ratio    Comment:                                   T. Chol/HDL Ratio  Men  Women                               1/2 Avg.Risk  3.4    3.3                                   Avg.Risk  5.0    4.4                                2X Avg.Risk  9.6    7.1                                3X Avg.Risk 23.4   11.0   Hemoglobin A1c     Status: None   Collection Time: 07/13/19 10:00 AM  Result Value Ref Range   Hgb A1c MFr Bld 5.5 4.8 - 5.6 %    Comment:          Prediabetes: 5.7 - 6.4          Diabetes: >6.4          Glycemic control for adults with diabetes: <7.0    Est. average glucose Bld gHb Est-mCnc 111 mg/dL  Comprehensive metabolic panel     Status: None   Collection Time: 07/13/19 10:00 AM  Result Value Ref Range   Glucose 87 65 - 99 mg/dL   BUN 12 6 - 24 mg/dL   Creatinine, Ser 0.82 0.57 - 1.00 mg/dL   GFR calc non Af Amer 89 >59 mL/min/1.73   GFR calc Af Amer 103 >59 mL/min/1.73   BUN/Creatinine Ratio 15 9 - 23   Sodium 140 134 - 144 mmol/L   Potassium 3.7 3.5 - 5.2 mmol/L   Chloride 103 96 - 106 mmol/L   CO2 25 20 - 29 mmol/L   Calcium 9.9 8.7 - 10.2 mg/dL   Total Protein 7.2 6.0 - 8.5 g/dL   Albumin 4.5 3.8 - 4.8 g/dL   Globulin, Total 2.7 1.5 - 4.5 g/dL   Albumin/Globulin Ratio 1.7 1.2 - 2.2   Bilirubin Total 0.3 0.0 - 1.2 mg/dL   Alkaline Phosphatase 88 39 - 117 IU/L   AST 17 0 - 40 IU/L   ALT 16 0 - 32 IU/L  CBC with Differential/Platelet     Status: None   Collection Time: 07/13/19 10:00 AM  Result Value Ref Range   WBC 5.7 3.4 - 10.8 x10E3/uL   RBC 4.55 3.77 - 5.28 x10E6/uL   Hemoglobin 13.0 11.1 - 15.9 g/dL   Hematocrit 38.6 34.0 - 46.6 %   MCV 85 79 - 97 fL   MCH 28.6 26.6 - 33.0 pg   MCHC 33.7 31.5 - 35.7 g/dL   RDW 14.2 11.7 - 15.4 %   Platelets 211 150 - 450 x10E3/uL   Neutrophils 50 Not Estab. %   Lymphs 34 Not Estab. %   Monocytes 9 Not Estab. %   Eos 6 Not Estab. %   Basos 1 Not  Estab. %   Neutrophils Absolute 2.9 1.4 - 7.0 x10E3/uL   Lymphocytes Absolute 1.9 0.7 - 3.1 x10E3/uL   Monocytes Absolute 0.5 0.1 - 0.9 x10E3/uL   EOS (ABSOLUTE) 0.3 0.0 - 0.4 x10E3/uL   Basophils Absolute 0.1 0.0 -  0.2 x10E3/uL   Immature Granulocytes 0 Not Estab. %   Immature Grans (Abs) 0.0 0.0 - 0.1 x10E3/uL     Impression and Recommendations:    1. Obesity (BMI 30.0-34.9)   2. Elevated lipoprotein(a)   3. Vitamin D deficiency, with symptoms   4. Insomnia, unspecified type   5. Right renal stone- sees Dr Karsten Ro     - Last seen during [place] for physical 01/20/2018.  Patient was told to return in 2 months for chronic follow-up to check on nutrition, dehydration, vertigo, constipation, exercise, progress with weight loss, and progress with conceiving.  Right-Sided Nephrolithiasis - Monitored by Dr. Karsten Ro of [facility].  - Encouraged patient to hydrate adequately and continue prudent dietary approaches to prevention.  Discussed methods to increase water intake, such as prudent sugar free, caffeine free additives with low/no-sodium.  - Patient will ask specialist regarding interplay of Vitamin D supplementation, calcium, and kidney stone prevention.  - Lengthy discussion held and all questions answered.  - Will continue to monitor alongside Urology.  - Reviewed recent lab work (07/13/2019) in depth with patient today.  All lab work within normal limits unless otherwise noted.  Extensive education provided and all questions answered.  Kidney Function - Serum creatinine = 0.82 last check, WNL. - GFR = 103 last check, WNL.  - Will continue to monitor and re-check yearly.  Elevated Lipoprotein(a) Triglycerides = 117, down from 146 one year prior. HDL = 54, up from 47 prior. LDL = 122, elevated, down from 132 prior.  - LDL elevated last check.  - Pt will continue current treatment regimen.  - Prudent dietary changes such as low saturated & trans fat diets for  hyperlipidemia and low carb diets for hypertriglyceridemia discussed with patient.    - Encouraged patient to follow AHA guidelines for regular exercise and also engage in weight loss if BMI above 25.   - Educational handouts provided at patient's desire and/ or told to look online at the [employer] website for further information.  - Will continue to monitor and re-check yearly.  Vitamin D Deficiency - 27.4 last check, down from 32.3 one year prior. - Discussed ideal range of 40-60.  - Per patient, recently started taking 5000 IU's one week ago. - Discontinued prescription Vitamin D since August 2020. - Encouraged patient to take Vitamin D as prescribed.  - Will continue to monitor and re-check as discussed in 3-4 months.  BMI Counseling - Obesity, Body mass index is 34.9 kg/m. Explained to patient what BMI refers to, and what it means medically.    Told patient to think about it as a "medical risk stratification measurement" and how increasing BMI is associated with increasing risk/ or worsening state of various diseases such as hypertension, hyperlipidemia, diabetes, premature OA, depression etc.  American Heart Association guidelines for healthy diet, basically Mediterranean diet, and exercise guidelines of 30 minutes 5 days per week or more discussed in detail.  Health counseling performed.  All questions answered.  - Per patient, desires to work on weight loss on her own.  She knows she can call in with any concerns or desire to schedule weight loss follow-up PRN.  Lifestyle & Preventative Health Maintenance - Advised patient to continue working toward exercising to improve overall mental, physical, and emotional health.    - Reviewed the "spokes of the wheel" of mood and health management.  Stressed the importance of ongoing prudent habits, including regular exercise, appropriate sleep hygiene, healthful dietary habits,  and prayer/meditation to relax.  -  Encouraged patient to engage in daily physical activity, especially a formal exercise routine.  Recommended that the patient eventually strive for at least 150 minutes of moderate cardiovascular activity per week according to guidelines established by the [facility].   - Healthy dietary habits encouraged, including low-carb, and high amounts of lean protein in diet.   - Patient should also consume adequate amounts of water.  Recommendations - Encouraged patient to start with small goals and baby steps, such as going for a 10-20 minute speed walk daily.  - Patient does not desire monitored weight loss at this time.  - Return in 4-6 months for chronic follow-up.     - As part of my medical decision making, I reviewed the following data within the [place] History obtained from pt /family, CMA notes reviewed and incorporated if applicable, Labs reviewed, Radiograph/ tests reviewed if applicable and OV notes from prior OV's with me, as well as other specialists she/he has seen since seeing me last, were all reviewed and used in my medical decision making process today.   - Additionally, discussion had with patient regarding txmnt plan, their biases about that plan etc were used in my medical decision making today.   - The patient agreed with the plan and demonstrated an understanding of the instructions.   No barriers to understanding were identified.    - Red flag symptoms and signs discussed in detail.  Patient expressed understanding regarding what to do in case of emergency\ urgent symptoms.  The patient was advised to call back or seek an in-person evaluation if the symptoms worsen or if the condition fails to improve as anticipated.   Return for f/up 4-6 months for weight loss, chronic care.     Meds ordered this encounter  Medications  . Vitamin D, Ergocalciferol, (DRISDOL) 1.25 MG (50000 UT) CAPS capsule    Sig: Take 1 capsule (50,000 Units total) by mouth every 7  (seven) days.    Dispense:  12 capsule    Refill:  7    Medications Discontinued During This Encounter  Medication Reason  . zolpidem (AMBIEN CR) 12.5 MG CR tablet Error  . Crisaborole (EUCRISA) 2 % OINT Error  . Prenatal Vit-Fe Fumarate-FA (MULTIVITAMIN-PRENATAL) 27-0.8 MG TABS tablet Error  . Vitamin D, Ergocalciferol, (DRISDOL) 50000 units CAPS capsule Reorder     Note:  This note was prepared with assistance of Dragon voice recognition software. Occasional wrong-word or sound-a-like substitutions may have occurred due to the inherent limitations of voice recognition software.   This document serves as a record of services personally performed by Mellody Dance, DO. It was created on her behalf by Toni Amend, a trained medical scribe. The creation of this record is based on the scribe's personal observations and the provider's statements to them.   This case required medical decision making of at least moderate complexity. The above documentation has been reviewed to be accurate and was completed by Marjory Sneddon, D.O.    Patient Care Team    Relationship Specialty Notifications Start End  Mellody Dance, DO PCP - General Family Medicine  12/30/16   Rayburn, Neta Mends, PA-C Physician Assistant Plastic Surgery  12/30/16   Kathie Rhodes, MD Consulting Physician Urology  12/30/16   Sherlyn Hay, DO Consulting Physician Obstetrics and Gynecology  12/30/16   Kathie Rhodes, MD Consulting Physician Urology  07/19/19     -Vitals obtained; medications/ allergies reconciled;  personal medical,  social, Sx etc.histories were updated by CMA, reviewed by me and are reflected in chart  Patient Active Problem List   Diagnosis Date Noted  . Obesity (BMI 30.0-34.9) 09/18/2015  . Insomnia 02/14/2017  . Vitamin D deficiency, with symptoms 01/10/2017  . Eczema-  all over-  12/30/2016  . 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  Weight loss counseling, encounter for 01/20/2018  . Chronic idiopathic constipation 01/20/2018  . Dehydration, mild 01/20/2018  . Vertigo/dizziness 01/20/2018  . Environmental and seasonal allergies 12/11/2017  . 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 Cold sore 12/11/2017  . Abnormal menses 11/18/2017  . Cough in adult 11/18/2017  . Acute sinusitis 11/18/2017  . 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 h/o Kidney stones 09/18/2015     Current Meds  Medication Sig  . Cholecalciferol (VITAMIN D3) 5000 units TABS 5,000 IU OTC vitamin D3 daily.  . Multiple Vitamins-Minerals (MULTIVITAMIN WITH MINERALS) tablet Take 1 tablet by mouth daily.  . Probiotic Product (PROBIOTIC ADVANCED PO) Take 1 capsule by mouth daily.  . Vitamin D, Ergocalciferol, (DRISDOL) 1.25 MG (50000 UT) CAPS capsule Take 1 capsule (50,000 Units total) by mouth every 7 (seven) days.  . [DISCONTINUED] Vitamin D, Ergocalciferol, (DRISDOL) 50000 units CAPS capsule TAKE 1 CAPSULE (50,000 UNITS TOTAL) BY MOUTH EVERY 7 (SEVEN) DAYS.     Allergies  Allergen Reactions  . Cefdinir Rash     ROS:  See above HPI for pertinent positives and negatives   Objective:   Blood pressure 120/80, height 5\' 3"  (1.6 m), weight 197 lb (89.4 kg), last menstrual period 06/28/2019.  (if some vitals are omitted, this means that patient was UNABLE to obtain them even though they were asked to get them prior to [place] today.  They were asked to call us at their earliest convenience with these once obtained.)  General: A & O * 3; visually in no acute distress; in usual state of health.  Skin: Visible skin appears normal and pt's usual skin color HEENT:  EOMI, head is normocephalic and atraumatic.  Sclera are anicteric. Neck has a good range of motion.  Lips are noncyanotic Chest: normal chest excursion and movement Respiratory: speaking in full sentences, no conversational dyspnea; no use of accessory muscles Psych: insight good, mood- appears  full

## 2019-07-19 NOTE — Patient Instructions (Addendum)
Kidney Stones  Kidney stones (urolithiasis) are solid, rock-like deposits that form inside of the organs that make urine (kidneys). A kidney stone may form in a kidney and move into the bladder, where it can cause intense pain and block the flow of urine. Kidney stones are created when high levels of certain minerals are found in the urine. They are usually passed through urination, but in some cases, medical treatment may be needed to remove them. What are the causes? Kidney stones may be caused by:  A condition in which certain glands produce too much parathyroid hormone (primary hyperparathyroidism), which causes too much calcium buildup in the blood.  Buildup of uric acid crystals in the bladder (hyperuricosuria). Uric acid is a chemical that the body produces when you eat certain foods. It usually exits the body in the urine.  Narrowing (stricture) of one or both of the tubes that drain urine from the kidneys to the bladder (ureters).  A kidney blockage that is present at birth (congenital obstruction).  Past surgery on the kidney or the ureters, such as gastric bypass surgery. What increases the risk? The following factors make you more likely to develop kidney stones:  Having had a kidney stone in the past.  Having a family history of kidney stones.  Not drinking enough water.  Eating a diet that is high in protein, salt (sodium), or sugar.  Being overweight or obese. What are the signs or symptoms? Symptoms of a kidney stone may include:  Nausea.  Vomiting.  Blood in the urine (hematuria).  Pain in the side of the abdomen, right below the ribs (flank pain). Pain usually spreads (radiates) to the groin.  Needing to urinate frequently or urgently. How is this diagnosed? This condition may be diagnosed based on:  Your medical history.  A physical exam.  Blood tests.  Urine tests.  CT scan.  Abdominal X-ray.  A procedure to examine the inside of the bladder  (cystoscopy). How is this treated? Treatment for kidney stones depends on the size, location, and makeup of the stones. Treatment may involve:  Analyzing your urine before and after you pass the stone through urination.  Being monitored at the hospital until you pass the stone through urination.  Increasing your fluid intake and decreasing the amount of calcium and protein in your diet.  A procedure to break up kidney stones in the bladder using: ? A focused beam of light (laser therapy). ? Shock waves (extracorporeal shock wave lithotripsy).  Surgery to remove kidney stones. This may be needed if you have severe pain or have stones that block your urinary tract. Follow these instructions at home: Eating and drinking  Drink enough fluid to keep your urine clear or pale yellow. This will help you to pass the kidney stone.  If directed, change your diet. This may include: ? Limiting how much sodium you eat. ? Eating more fruits and vegetables. ? Limiting how much meat, poultry, fish, and eggs you eat.  Follow instructions from your health care provider about eating or drinking restrictions. General instructions  Collect urine samples as told by your health care provider. You may need to collect a urine sample: ? 24 hours after you pass the stone. ? 8-12 weeks after passing the kidney stone, and every 6-12 months after that.  Strain your urine every time you urinate, for as long as directed. Use the strainer that your health care provider recommends.  Do not throw out the kidney stone after passing   it. Keep the stone so it can be tested by your health care provider. Testing the makeup of your kidney stone may help prevent you from getting kidney stones in the future.  Take over-the-counter and prescription medicines only as told by your health care provider.  Keep all follow-up visits as told by your health care provider. This is important. You may need follow-up X-rays or  ultrasounds to make sure that your stone has passed. How is this prevented? To prevent another kidney stone:  Drink enough fluid to keep your urine clear or pale yellow. This is the best way to prevent kidney stones.  Eat a healthy diet and follow recommendations from your health care provider about foods to avoid. You may be instructed to eat a low-protein diet. Recommendations vary depending on the type of kidney stone that you have.  Maintain a healthy weight. Contact a health care provider if:  You have pain that gets worse or does not get better with medicine. Get help right away if:  You have a fever or chills.  You develop severe pain.  You develop new abdominal pain.  You faint.  You are unable to urinate. This information is not intended to replace advice given to you by your health care provider. Make sure you discuss any questions you have with your health care provider. Document Released: 07/08/2005 Document Revised: 02/17/2018 Document Reviewed: 12/22/2015 Elsevier Patient Education  2020 [employer].    Dietary Guidelines to Help Prevent Kidney Stones Kidney stones are deposits of minerals and salts that form inside your kidneys. Your risk of developing kidney stones may be greater depending on your diet, your lifestyle, the medicines you take, and whether you have certain medical conditions. Most people can reduce their chances of developing kidney stones by following the instructions below. Depending on your overall health and the type of kidney stones you tend to develop, your dietitian may give you more specific instructions. What are tips for following this plan? Reading food labels  Choose foods with "no salt added" or "low-salt" labels. Limit your sodium intake to less than 1500 mg per day.  Choose foods with calcium for each meal and snack. Try to eat about 300 mg of calcium at each meal. Foods that contain 200-500 mg of calcium per serving include: ? 8 oz  (237 ml) of milk, fortified nondairy milk, and fortified fruit juice. ? 8 oz (237 ml) of kefir, yogurt, and soy yogurt. ? 4 oz (118 ml) of tofu. ? 1 oz of cheese. ? 1 cup (300 g) of dried figs. ? 1 cup (91 g) of cooked broccoli. ? 1-3 oz can of sardines or mackerel.  Most people need 1000 to 1500 mg of calcium each day. Talk to your dietitian about how much calcium is recommended for you. Shopping  Buy plenty of fresh fruits and vegetables. Most people do not need to avoid fruits and vegetables, even if they contain nutrients that may contribute to kidney stones.  When shopping for convenience foods, choose: ? Whole pieces of fruit. ? Premade salads with dressing on the side. ? Low-fat fruit and yogurt smoothies.  Avoid buying frozen meals or prepared deli foods.  Look for foods with live cultures, such as yogurt and kefir. Cooking  Do not add salt to food when cooking. Place a salt shaker on the table and allow each person to add his or her own salt to taste.  Use vegetable protein, such as beans, textured vegetable protein (TVP),  or tofu instead of meat in pasta, casseroles, and soups. Meal planning   Eat less salt, if told by your dietitian. To do this: ? Avoid eating processed or premade food. ? Avoid eating fast food.  Eat less animal protein, including cheese, meat, poultry, or fish, if told by your dietitian. To do this: ? Limit the number of times you have meat, poultry, fish, or cheese each week. Eat a diet free of meat at least 2 days a week. ? Eat only one serving each day of meat, poultry, fish, or seafood. ? When you prepare animal protein, cut pieces into small portion sizes. For most meat and fish, one serving is about the size of one deck of cards.  Eat at least 5 servings of fresh fruits and vegetables each day. To do this: ? Keep fruits and vegetables on hand for snacks. ? Eat 1 piece of fruit or a handful of berries with breakfast. ? Have a salad and fruit  at lunch. ? Have two kinds of vegetables at dinner.  Limit foods that are high in a substance called oxalate. These include: ? Spinach. ? Rhubarb. ? Beets. ? Potato chips and french fries. ? Nuts.  If you regularly take a diuretic medicine, make sure to eat at least 1-2 fruits or vegetables high in potassium each day. These include: ? Avocado. ? Banana. ? Orange, prune, carrot, or tomato juice. ? Baked potato. ? Cabbage. ? Beans and split peas. General instructions   Drink enough fluid to keep your urine clear or pale yellow. This is the most important thing you can do.  Talk to your health care provider and dietitian about taking daily supplements. Depending on your health and the cause of your kidney stones, you may be advised: ? Not to take supplements with vitamin C. ? To take a calcium supplement. ? To take a daily probiotic supplement. ? To take other supplements such as magnesium, fish oil, or vitamin B6.  Take all medicines and supplements as told by your health care provider.  Limit alcohol intake to no more than 1 drink a day for nonpregnant women and 2 drinks a day for men. One drink equals 12 oz of beer, 5 oz of wine, or 1 oz of hard liquor.  Lose weight if told by your health care provider. Work with your dietitian to find strategies and an eating plan that works best for you. What foods are not recommended? Limit your intake of the following foods, or as told by your dietitian. Talk to your dietitian about specific foods you should avoid based on the type of kidney stones and your overall health. Grains Breads. Bagels. Rolls. Baked goods. Salted crackers. Cereal. Pasta. Vegetables Spinach. Rhubarb. Beets. Canned vegetables. Angie Fava. Olives. Meats and other protein foods Nuts. Nut butters. Large portions of meat, poultry, or fish. Salted or cured meats. Deli meats. Hot dogs. Sausages. Dairy Cheese. Beverages Regular soft drinks. Regular vegetable  juice. Seasonings and other foods Seasoning blends with salt. Salad dressings. Canned soups. Soy sauce. Ketchup. Barbecue sauce. Canned pasta sauce. Casseroles. Pizza. Lasagna. Frozen meals. Potato chips. [place] fries. Summary  You can reduce your risk of kidney stones by making changes to your diet.  The most important thing you can do is drink enough fluid. You should drink enough fluid to keep your urine clear or pale yellow.  Ask your health care provider or dietitian how much protein from animal sources you should eat each day, and also how much  salt and calcium you should have each day. This information is not intended to replace advice given to you by your health care provider. Make sure you discuss any questions you have with your health care provider. Document Released: 11/02/2010 Document Revised: 10/28/2018 Document Reviewed: 06/18/2016 Elsevier Patient Education  2020 [employer] for a Low Cholesterol, Low Saturated Fat Diet Fats - Limit total intake of fats and oils. - Avoid butter, stick margarine, shortening, lard, palm and coconut oils. - Limit mayonnaise, salad dressings, gravies and sauces, unless they are homemade with low-fat ingredients. - Limit chocolate. - Choose low-fat and nonfat products, such as low-fat mayonnaise, low-fat or non-hydrogenated peanut butter, low-fat or fat-free salad dressings and nonfat gravy. - Use vegetable oil, such as canola or olive oil. - Look for margarine that does not contain trans fatty acids. - Use nuts in moderate amounts. - Read ingredient labels carefully to determine both amount and type of fat present in foods. Limit saturated and trans fats! - Avoid high-fat processed and convenience foods.  Meats and Meat Alternatives - Choose fish, chicken, [place] and lean meats. - Use dried beans, peas, lentils and tofu. - Limit egg yolks to three to four per week. - If you eat red meat, limit to no more than three  servings per week and choose loin or round cuts. - Avoid fatty meats, such as bacon, sausage, franks, luncheon meats and ribs. - Avoid all organ meats, including liver.  Dairy - Choose nonfat or low-fat milk, yogurt and cottage cheese. - Most cheeses are high in fat. Choose cheeses made from non-fat milk, such as mozzarella and ricotta cheese. - Choose light or fat-free cream cheese and sour cream. - Avoid cream and sauces made with cream.  Fruits and Vegetables - Eat a wide variety of fruits and vegetables. - Use lemon juice, vinegar or "mist" olive oil on vegetables. - Avoid adding sauces, fat or oil to vegetables.  Breads, Cereals and Grains - Choose whole-grain breads, cereals, pastas and rice. - Avoid high-fat snack foods, such as granola, cookies, pies, pastries, doughnuts and croissants.  Cooking Tips - Avoid deep fried foods. - Trim visible fat off meats and remove skin from poultry before cooking. - Bake, broil, boil, poach or roast poultry, fish and lean meats. - Drain and discard fat that drains out of meat as you [place] it. - Add little or no fat to foods. - Use vegetable oil sprays to grease pans for cooking or baking. - Steam vegetables. - Use herbs or no-oil marinades to flavor foods.

## 2019-09-07 ENCOUNTER — Telehealth: Payer: Self-pay | Admitting: Family Medicine

## 2019-09-07 ENCOUNTER — Other Ambulatory Visit: Payer: Self-pay

## 2019-09-07 ENCOUNTER — Encounter (HOSPITAL_COMMUNITY): Payer: Self-pay | Admitting: Emergency Medicine

## 2019-09-07 ENCOUNTER — Emergency Department (HOSPITAL_COMMUNITY): Payer: BC Managed Care – PPO

## 2019-09-07 ENCOUNTER — Emergency Department (HOSPITAL_COMMUNITY)
Admission: EM | Admit: 2019-09-07 | Discharge: 2019-09-07 | Disposition: A | Discharge status: Home / Self Care | Payer: BC Managed Care – PPO | Attending: Emergency Medicine | Admitting: Emergency Medicine

## 2019-09-07 DIAGNOSIS — R1033 Periumbilical pain: Secondary | ICD-10-CM | POA: Diagnosis present

## 2019-09-07 DIAGNOSIS — Z79899 Other long term (current) drug therapy: Secondary | ICD-10-CM | POA: Diagnosis not present

## 2019-09-07 DIAGNOSIS — K439 Ventral hernia without obstruction or gangrene: Secondary | ICD-10-CM

## 2019-09-07 LAB — LIPASE, BLOOD: Lipase: 22 U/L (ref 11–51)

## 2019-09-07 LAB — CBC WITH DIFFERENTIAL/PLATELET
Abs Immature Granulocytes: 0.02 10*3/uL (ref 0.00–0.07)
Basophils Absolute: 0.1 10*3/uL (ref 0.0–0.1)
Basophils Relative: 1 %
Eosinophils Absolute: 0.3 10*3/uL (ref 0.0–0.5)
Eosinophils Relative: 4 %
HCT: 41.1 % (ref 36.0–46.0)
Hemoglobin: 13.2 g/dL (ref 12.0–15.0)
Immature Granulocytes: 0 %
Lymphocytes Relative: 33 %
Lymphs Abs: 2.1 10*3/uL (ref 0.7–4.0)
MCH: 28.1 pg (ref 26.0–34.0)
MCHC: 32.1 g/dL (ref 30.0–36.0)
MCV: 87.6 fL (ref 80.0–100.0)
Monocytes Absolute: 0.4 10*3/uL (ref 0.1–1.0)
Monocytes Relative: 6 %
Neutro Abs: 3.6 10*3/uL (ref 1.7–7.7)
Neutrophils Relative %: 56 %
Platelets: 201 10*3/uL (ref 150–400)
RBC: 4.69 MIL/uL (ref 3.87–5.11)
RDW: 13.2 % (ref 11.5–15.5)
WBC: 6.4 10*3/uL (ref 4.0–10.5)
nRBC: 0 % (ref 0.0–0.2)

## 2019-09-07 LAB — URINALYSIS, ROUTINE W REFLEX MICROSCOPIC
Bacteria, UA: NONE SEEN
Bilirubin Urine: NEGATIVE
Glucose, UA: NEGATIVE mg/dL
Hgb urine dipstick: NEGATIVE
Ketones, ur: NEGATIVE mg/dL
Nitrite: NEGATIVE
Protein, ur: NEGATIVE mg/dL
Specific Gravity, Urine: 1.001 — ABNORMAL LOW (ref 1.005–1.030)
pH: 7 (ref 5.0–8.0)

## 2019-09-07 LAB — COMPREHENSIVE METABOLIC PANEL
ALT: 20 U/L (ref 0–44)
AST: 16 U/L (ref 15–41)
Albumin: 4.3 g/dL (ref 3.5–5.0)
Alkaline Phosphatase: 72 U/L (ref 38–126)
Anion gap: 9 (ref 5–15)
BUN: 10 mg/dL (ref 6–20)
CO2: 26 mmol/L (ref 22–32)
Calcium: 9.7 mg/dL (ref 8.9–10.3)
Chloride: 103 mmol/L (ref 98–111)
Creatinine, Ser: 0.69 mg/dL (ref 0.44–1.00)
GFR calc Af Amer: >60 mL/min (ref >60)
GFR calc non Af Amer: >60 mL/min (ref >60)
Glucose, Bld: 87 mg/dL (ref 70–99)
Potassium: 3.7 mmol/L (ref 3.5–5.1)
Sodium: 138 mmol/L (ref 135–145)
Total Bilirubin: 0.5 mg/dL (ref 0.3–1.2)
Total Protein: 7.7 g/dL (ref 6.5–8.1)

## 2019-09-07 LAB — I-STAT BETA HCG BLOOD, ED (MC, WL, AP ONLY): I-stat hCG, quantitative: <5 m[IU]/mL (ref <5)

## 2019-09-07 MED ORDER — SODIUM CHLORIDE 0.9 % IV BOLUS
500.0000 mL | Freq: Once | INTRAVENOUS | Status: AC
  Administered 2019-09-07: 500 mL via INTRAVENOUS

## 2019-09-07 MED ORDER — SODIUM CHLORIDE (PF) 0.9 % IJ SOLN
INTRAMUSCULAR | Status: AC
  Filled 2019-09-07: qty 50

## 2019-09-07 MED ORDER — IOHEXOL 300 MG/ML  SOLN: 100.0000 mL | Freq: Once | INTRAMUSCULAR | Status: AC | PRN

## 2019-09-07 NOTE — ED Notes (Signed)
An After Visit Summary was printed and given to the patient. Discharge instructions given and no further questions at this time.  

## 2019-09-07 NOTE — Telephone Encounter (Signed)
Patient called states she thinks has injured self /ABD muscle-- per patient bent to pick up daughter Sunday & has been in pain since then---Advised pt no available appts for provider this week  & would forward message to med asst for review w/provider on what advice to give her (shared she maybe sent to UC or ED for treatment).  --Med asst pls call pt at [phone].  --glh

## 2019-09-07 NOTE — ED Triage Notes (Signed)
Pt reports Sunday when she picked up her 30 lb child and put her on the bed, reports got abd pains and knot around umbilical area. Reports her BP was high at [facility] today and was referred to Ed fro further evaluation.

## 2019-09-07 NOTE — ED Provider Notes (Signed)
[facility] DEPT Provider Note   CSN: [id number] Arrival date & time: 09/07/19  1803     History Chief Complaint  Patient presents with  . Abdominal Pain    Katelyn Wright is a 44 y.o. female.  The history is provided by the patient and medical records. No language interpreter was used.  Abdominal Pain  Katelyn Wright is a 44 y.o. female who presents to the Emergency Department complaining of abdominal pain. She presents the emergency department from urgent care for evaluation of periumbilical abdominal pain. Her pain began abruptly two days ago when she was lifting her 34 pound daughter. Pain is located in the umbilicus and just superior to this area. Pain is described as severe but does improve with Tylenol. It is worse with movement and significantly worse with palpation. She feels like there is a lump in the area. She denies any fevers, nausea, diarrhea. No prior similar symptoms. She went to urgent care and was referred to the emergency department due to elevated blood pressure and inability to obtain imaging. She has a history of vitamin D deficiency, no additional medical problems. She is two weeks late for her menstrual cycle.    Past Medical History:  Diagnosis Date  . Eczema   . Heart murmur   . Seasonal allergies     Patient Active Problem List   Diagnosis Date Noted  . 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 Weight loss counseling, encounter for 01/20/2018  . Chronic idiopathic constipation 01/20/2018  . Dehydration, mild 01/20/2018  . Vertigo/dizziness 01/20/2018  . Environmental and seasonal allergies 12/11/2017  . 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 Cold sore 12/11/2017  . Abnormal menses 11/18/2017  . Cough in adult 11/18/2017  . Acute sinusitis 11/18/2017  . Insomnia 02/14/2017  . Vitamin D deficiency, with symptoms 01/10/2017  . Eczema-  all over-  12/30/2016  . 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 h/o Kidney stones 09/18/2015  . Obesity (BMI 30.0-34.9) 09/18/2015    Past Surgical History:  Procedure Laterality Date  . ADENOIDECTOMY    . CYSTOSCOPY WITH URETEROSCOPY AND STENT PLACEMENT Right 09/29/2015   Procedure: RIGHT  URETEROSCOPY AND STENT PLACEMENT, RETROGRADE;  Surgeon: Kathie Rhodes, MD;  Location: [facility];  Service: Urology;  Laterality: Right;  . HOLMIUM LASER APPLICATION Right [id number]   Procedure: WITH HOLMIUM LASER;  Surgeon: Kathie Rhodes, MD;  Location: [facility];  Service: Urology;  Laterality: Right;  . right stent placed     Right Renal Stent  . TONSILLECTOMY AND ADENOIDECTOMY  1994     OB History   No obstetric history on file.     Family History  Problem Relation Age of Onset  . Arthritis Mother   . Hypertension Mother   . Diabetes Mother   . Hypertension Father   . Alcohol abuse Maternal Uncle   . Heart disease Maternal Grandfather   . Breast cancer Paternal Grandmother     Social History   Tobacco Use  . Smoking status: Never Smoker  . Smokeless tobacco: Never Used  Substance Use Topics  . Alcohol use: No  . Drug use: No    Home Medications Prior to Admission medications   Medication Sig Start Date End Date Taking? Authorizing Provider  acetaminophen (TYLENOL) 500 MG tablet Take 1,000 mg by mouth every 6 (six) hours as needed for moderate pain.   Yes [provider]  Cholecalciferol (VITAMIN D3) 5000 units TABS 5,000 IU OTC vitamin  D3 daily. 01/10/17  Yes Opalski, Neoma Laming, DO  Prenatal Vit-Fe Fumarate-FA (PRENATAL MULTIVITAMIN) TABS tablet Take 1 tablet by mouth daily at 12 noon.   Yes [provider]  Vitamin D, Ergocalciferol, (DRISDOL) 1.25 MG (50000 UT) CAPS capsule Take 1 capsule (50,000 Units total) by mouth every 7 (seven) days. 07/19/19   Mellody Dance, DO    Allergies    Patient has no known allergies.  Review of Systems   Review of Systems  Gastrointestinal: Positive for abdominal pain.  All other systems  reviewed and are negative.   Physical Exam Updated Vital Signs BP (!) 155/90 (BP Location: Left Arm)   Pulse 72   Temp 98 F (36.7 C) (Oral)   Resp 20   LMP 07/24/2019   SpO2 99%   Physical Exam Vitals and nursing note reviewed.  Constitutional:      Appearance: She is well-developed.  HENT:     Head: Normocephalic and atraumatic.  Cardiovascular:     Rate and Rhythm: Normal rate and regular rhythm.     Heart sounds: No murmur.  Pulmonary:     Effort: Pulmonary effort is normal. No respiratory distress.     Breath sounds: Normal breath sounds.  Abdominal:     Palpations: Abdomen is soft.     Tenderness: There is no guarding or rebound.     Comments: Moderate super umbilical tenderness to palpation without palpable mass. There is also mild to moderate right lower quadrant tenderness.  Musculoskeletal:        General: No tenderness.  Skin:    General: Skin is warm and dry.  Neurological:     Mental Status: She is alert and oriented to person, place, and time.  Psychiatric:        Behavior: Behavior normal.     ED Results / Procedures / Treatments   Labs (all labs ordered are listed, but only abnormal results are displayed) Labs Reviewed  URINALYSIS, ROUTINE W REFLEX MICROSCOPIC - Abnormal; Notable for the following components:      Result Value   Specific Gravity, Urine 1.001 (*)    Leukocytes,Ua TRACE (*)    All other components within normal limits  LIPASE, BLOOD  COMPREHENSIVE METABOLIC PANEL  CBC WITH DIFFERENTIAL/PLATELET  I-STAT BETA HCG BLOOD, ED ([facility], [facility], [facility] ONLY)  I-STAT BETA HCG BLOOD, ED ([facility], [facility], [facility] ONLY)    EKG None  Radiology CT ABDOMEN PELVIS WO CONTRAST  Result Date: 09/07/2019 CLINICAL DATA:  Right lower quadrant pain for 2 days. EXAM: CT ABDOMEN AND PELVIS WITHOUT CONTRAST TECHNIQUE: Multidetector CT imaging of the abdomen and pelvis was performed following the standard protocol without IV contrast. COMPARISON:  01/25/2019. FINDINGS: Lower  chest: Unremarkable Hepatobiliary: No focal abnormality in the liver on this study without intravenous contrast. There is no evidence for gallstones, gallbladder wall thickening, or pericholecystic fluid. No intrahepatic or extrahepatic biliary dilation. Pancreas: No focal mass lesion. No dilatation of the main duct. No intraparenchymal cyst. No peripancreatic edema. Spleen: No splenomegaly. No focal mass lesion. Adrenals/Urinary Tract: No adrenal nodule or mass. 2 mm nonobstructing stone identified interpolar right kidney. Left kidney unremarkable. No evidence for hydroureter. The urinary bladder appears normal for the degree of distention. Stomach/Bowel: Stomach is unremarkable. No gastric wall thickening. No evidence of outlet obstruction. Duodenum is normally positioned as is the ligament of Treitz. No small bowel wall thickening. No small bowel dilatation. The terminal ileum is normal. The appendix is best seen on coronal images and is unremarkable.  No gross colonic mass. No colonic wall thickening. Vascular/Lymphatic: No abdominal aortic aneurysm. No abdominal aortic atherosclerotic calcification. There is no gastrohepatic or hepatoduodenal ligament lymphadenopathy. No retroperitoneal or mesenteric lymphadenopathy. No pelvic sidewall lymphadenopathy. Reproductive: The uterus is unremarkable.  There is no adnexal mass. Other: No intraperitoneal free fluid. Musculoskeletal: Small supraumbilical midline ventral hernia contains only fat and the hernia sac measures 1.9 x 1.6 x 3.2 cm. There is some minimal fluid or soft tension density in the hernia sac. No edema or inflammation in the subcutaneous fat surrounding the hernia. Overall imaging appearance is not substantially changed since prior study of 01/25/2019. No worrisome lytic or sclerotic osseous abnormality. IMPRESSION: 1. No acute findings in the abdomen or pelvis. Specifically, no findings to explain the patient's history of right lower quadrant pain. 2. 2  mm nonobstructing stone interpolar right kidney. 3. Small supraumbilical midline ventral hernia is similar in appearance to prior study. Electronically Signed   By: Misty Stanley M.D.   On: 09/07/2019 21:04    Procedures Procedures (including critical care time)  Medications Ordered in ED Medications  sodium chloride 0.9 % bolus 500 mL (0 mLs Intravenous Stopped 09/07/19 2050)  iohexol (OMNIPAQUE) 300 MG/ML solution 100 mL (100 mLs Intravenous Contrast Given 09/07/19 2035)    ED Course  I have reviewed the triage vital signs and the nursing notes.  Pertinent labs & imaging results that were available during my care of the patient were reviewed by me and considered in my medical decision making (see chart for details).    MDM Rules/Calculators/A&P                     patient here for evaluation of abdominal pain that began after lifting her child a few days ago. She does have significant periumbilical abdominal pain without palpable mass. She also has right lower quadrant tenderness on examination. CT abdomen pelvis demonstrates ventral hernia without evidence of incarceration. Patient likely had abdominal wall strain, possible worsening of her ventral hernia. Discussed with patient home care for abdominal pain, hernia. Discussed outpatient follow-up and return precautions.  Final Clinical Impression(s) / ED Diagnoses Final diagnoses:  Periumbilical abdominal pain  Ventral hernia without obstruction or gangrene    Rx / DC Orders ED Discharge Orders    None       Quintella Reichert, MD 09/07/19 2232

## 2019-09-07 NOTE — Telephone Encounter (Signed)
Called patient to advise to go to UC for evaluation since we are unable to see in our office until March-Per Dr. Raliegh Scarlet.   Patient was checking in at urgent care. AS, CMA

## 2019-10-05 LAB — HM MAMMOGRAPHY

## 2019-10-05 LAB — HM PAP SMEAR

## 2020-02-18 ENCOUNTER — Other Ambulatory Visit: Payer: Self-pay | Admitting: *Deleted

## 2020-02-18 ENCOUNTER — Ambulatory Visit: Payer: BC Managed Care – PPO | Attending: Internal Medicine

## 2020-02-18 DIAGNOSIS — Z20822 Contact with and (suspected) exposure to covid-19: Secondary | ICD-10-CM

## 2020-02-18 NOTE — Progress Notes (Unsigned)
lab

## 2020-02-19 LAB — SARS-COV-2, NAA 2 DAY TAT

## 2020-02-19 LAB — NOVEL CORONAVIRUS, NAA: SARS-CoV-2, NAA: NOT DETECTED

## 2020-03-17 ENCOUNTER — Other Ambulatory Visit: Payer: BC Managed Care – PPO

## 2020-03-17 ENCOUNTER — Other Ambulatory Visit: Payer: Self-pay

## 2020-03-17 DIAGNOSIS — Z20822 Contact with and (suspected) exposure to covid-19: Secondary | ICD-10-CM

## 2020-03-18 LAB — SARS-COV-2, NAA 2 DAY TAT

## 2020-03-18 LAB — NOVEL CORONAVIRUS, NAA: SARS-CoV-2, NAA: NOT DETECTED

## 2020-10-30 DIAGNOSIS — Z124 Encounter for screening for malignant neoplasm of cervix: Secondary | ICD-10-CM | POA: Diagnosis not present

## 2020-10-30 DIAGNOSIS — Z1239 Encounter for other screening for malignant neoplasm of breast: Secondary | ICD-10-CM | POA: Diagnosis not present

## 2020-10-30 DIAGNOSIS — Z1231 Encounter for screening mammogram for malignant neoplasm of breast: Secondary | ICD-10-CM | POA: Diagnosis not present

## 2020-10-30 DIAGNOSIS — Z01411 Encounter for gynecological examination (general) (routine) with abnormal findings: Secondary | ICD-10-CM | POA: Diagnosis not present

## 2020-11-13 DIAGNOSIS — R899 Unspecified abnormal finding in specimens from other organs, systems and tissues: Secondary | ICD-10-CM | POA: Diagnosis not present

## 2020-12-13 DIAGNOSIS — S29012A Strain of muscle and tendon of back wall of thorax, initial encounter: Secondary | ICD-10-CM | POA: Diagnosis not present

## 2020-12-13 DIAGNOSIS — R030 Elevated blood-pressure reading, without diagnosis of hypertension: Secondary | ICD-10-CM | POA: Diagnosis not present

## 2020-12-13 IMAGING — CT CT ABD-PELV W/O CM
2 of 4 series · 15 of 46 positions shown, 17 images · non-contrast
Comparison: 01/25/2019.

CLINICAL DATA: Right lower quadrant pain for 2 days.

EXAM:
CT ABDOMEN AND PELVIS WITHOUT CONTRAST
TECHNIQUE: Multidetector CT imaging of the abdomen and pelvis was performed
following the standard protocol without IV contrast.

[Series 2: axial st · axial · 0.73mm/px · z∈[+978,+1362]mm · 12 of 87 slices shown, 14 images]
[im 5/87  soft-tissue]
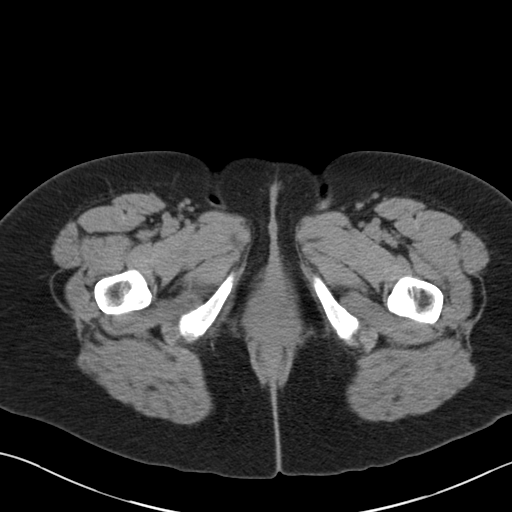
[im 5/87  bone]
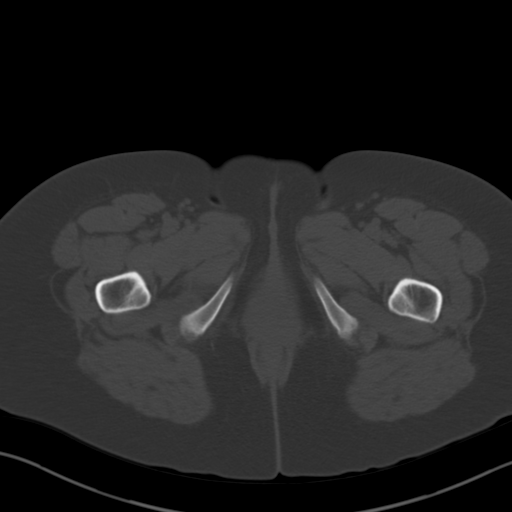
[im 14/87  soft-tissue]
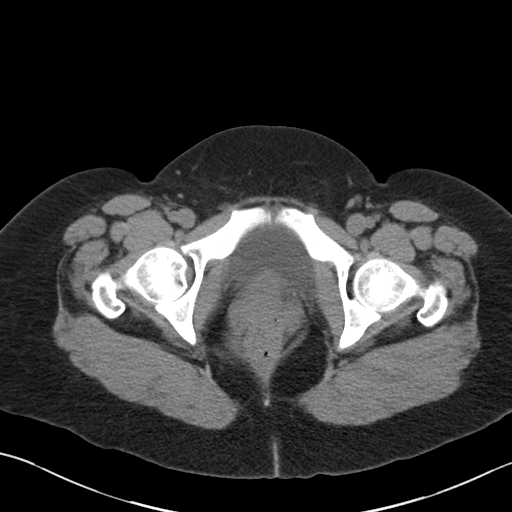
[im 19/87  soft-tissue]
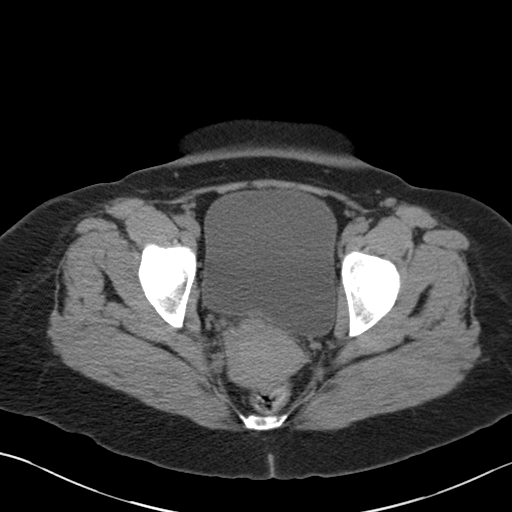
[im 28/87  soft-tissue]
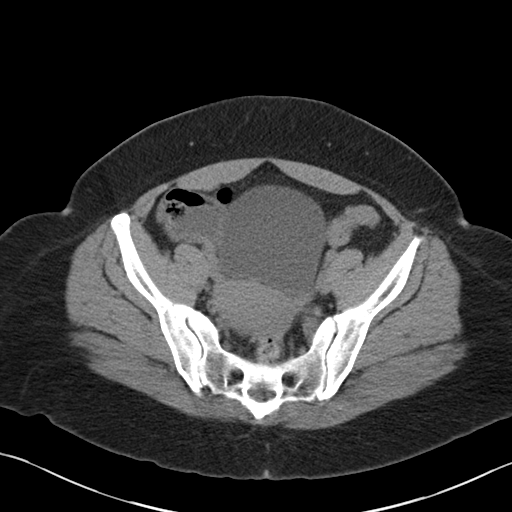
[im 32/87  soft-tissue]
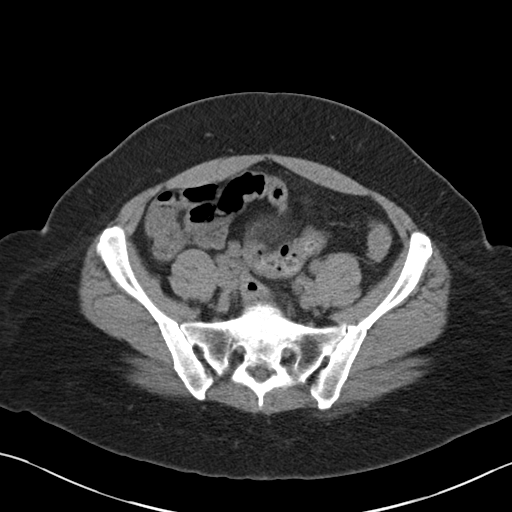
[im 41/87  soft-tissue]
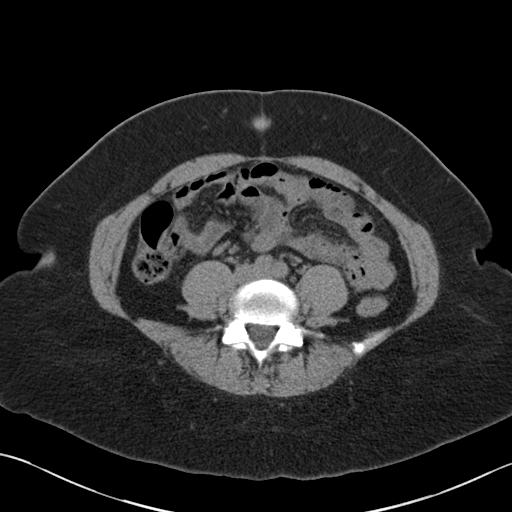
[im 46/87  soft-tissue]
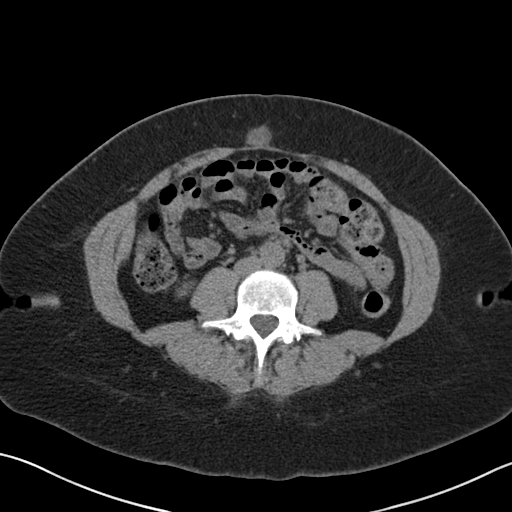
[im 55/87  soft-tissue]
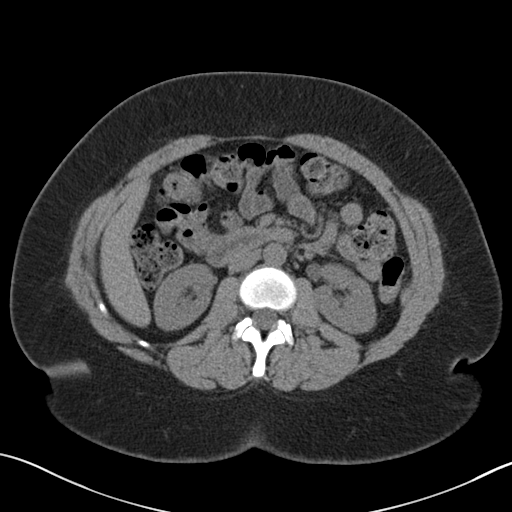
[im 59/87  soft-tissue]
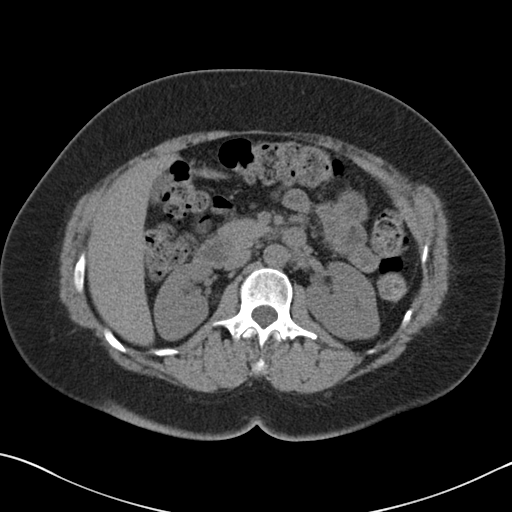
[im 59/87  bone]
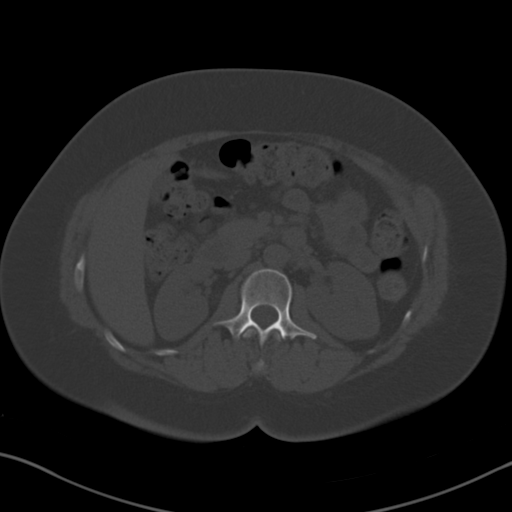
[im 68/87  soft-tissue]
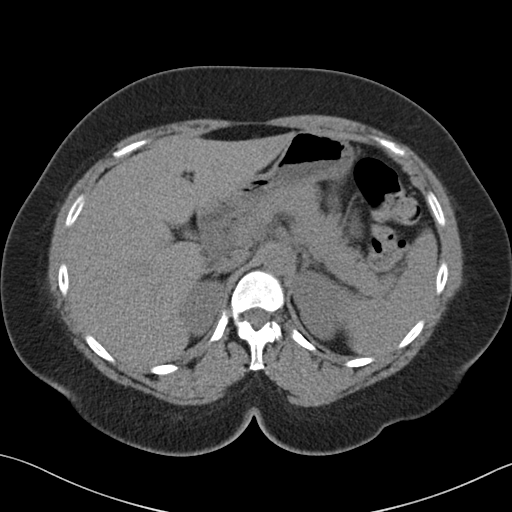
[im 73/87  soft-tissue]
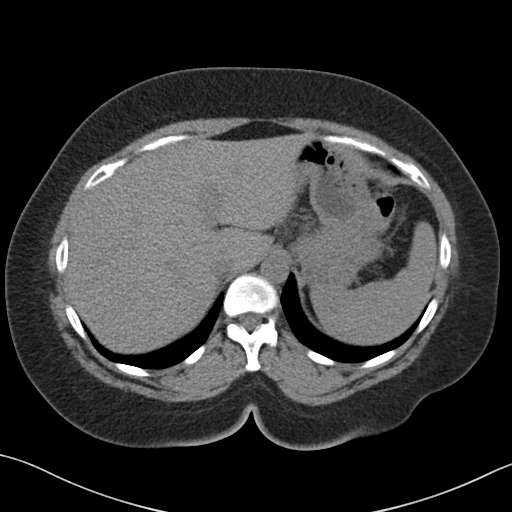
[im 82/87  soft-tissue]
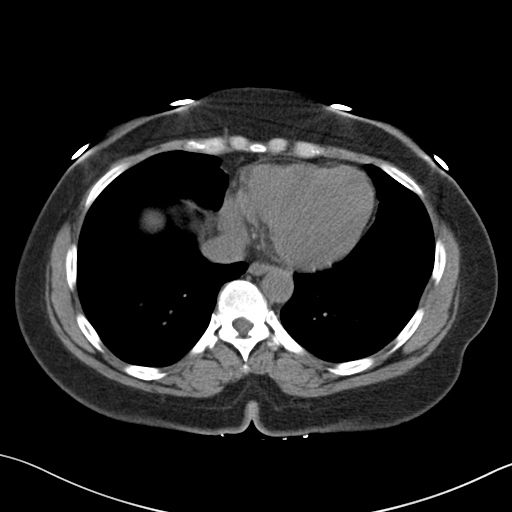

[Series 5: coronal st · coronal · 0.71mm/px · 3 of 139 slices shown]
[im 47/139  soft-tissue]
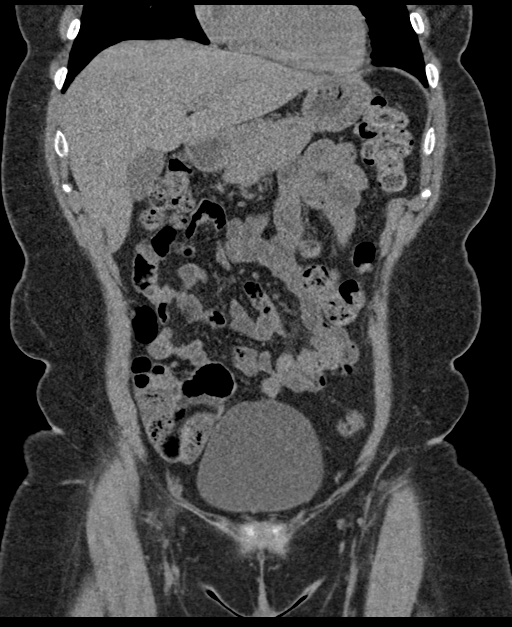
[im 62/139  soft-tissue]
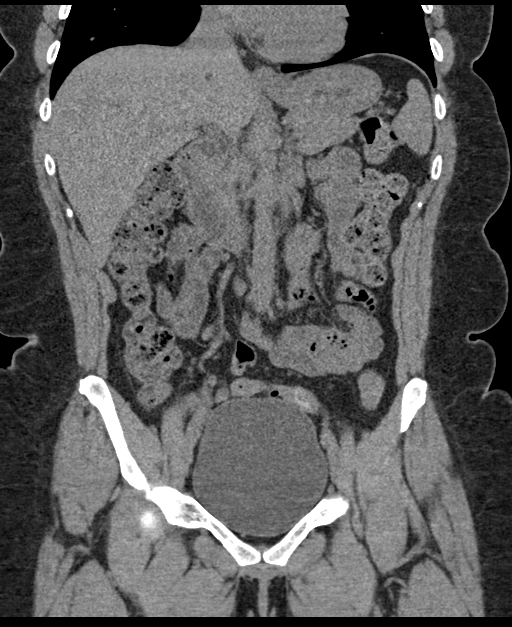
[im 77/139  soft-tissue]
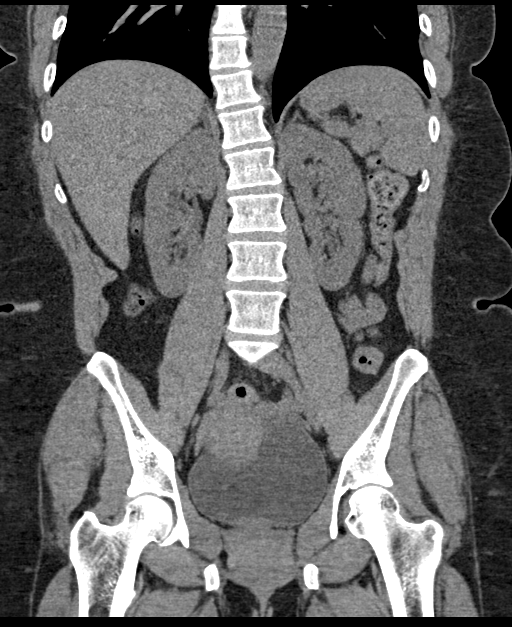

[15 of 46 positions shown; findings below may reference images not displayed]

FINDINGS: Lower chest: Unremarkable

Hepatobiliary: No focal abnormality in the liver on this study
without intravenous contrast. There is no evidence for gallstones,
gallbladder wall thickening, or pericholecystic fluid. No
intrahepatic or extrahepatic biliary dilation.

Pancreas: No focal mass lesion. No dilatation of the main duct. No
intraparenchymal cyst. No peripancreatic edema.

Spleen: No splenomegaly. No focal mass lesion.

Adrenals/Urinary Tract: No adrenal nodule or mass. 2 mm
nonobstructing stone identified interpolar right kidney. Left kidney
unremarkable. No evidence for hydroureter. The urinary bladder
appears normal for the degree of distention.

Stomach/Bowel: Stomach is unremarkable. No gastric wall thickening.
No evidence of outlet obstruction. Duodenum is normally positioned
as is the ligament of Treitz. No small bowel wall thickening. No
small bowel dilatation. The terminal ileum is normal. The appendix
is best seen on coronal images and is unremarkable. No gross colonic
mass. No colonic wall thickening.

Vascular/Lymphatic: No abdominal aortic aneurysm. No abdominal
aortic atherosclerotic calcification. There is no gastrohepatic or
hepatoduodenal ligament lymphadenopathy. No retroperitoneal or
mesenteric lymphadenopathy. No pelvic sidewall lymphadenopathy.

Reproductive: The uterus is unremarkable.  There is no adnexal mass.

Other: No intraperitoneal free fluid.

Musculoskeletal: Small supraumbilical midline ventral hernia
contains only fat and the hernia sac measures 1.9 x 1.6 x 3.2 cm.
There is some minimal fluid or soft tension density in the hernia
sac. No edema or inflammation in the subcutaneous fat surrounding
the hernia. Overall imaging appearance is not substantially changed
since prior study of 01/25/2019. No worrisome lytic or sclerotic
osseous abnormality.
IMPRESSION: 1. No acute findings in the abdomen or pelvis. Specifically, no
findings to explain the patient's history of right lower quadrant
pain.
2. 2 mm nonobstructing stone interpolar right kidney.
3. Small supraumbilical midline ventral hernia is similar in
appearance to prior study.

## 2021-01-03 DIAGNOSIS — B353 Tinea pedis: Secondary | ICD-10-CM | POA: Diagnosis not present

## 2021-01-03 DIAGNOSIS — S93411A Sprain of calcaneofibular ligament of right ankle, initial encounter: Secondary | ICD-10-CM | POA: Diagnosis not present

## 2021-01-03 DIAGNOSIS — M67371 Transient synovitis, right ankle and foot: Secondary | ICD-10-CM | POA: Diagnosis not present

## 2021-02-14 DIAGNOSIS — F419 Anxiety disorder, unspecified: Secondary | ICD-10-CM | POA: Diagnosis not present

## 2021-02-14 DIAGNOSIS — R030 Elevated blood-pressure reading, without diagnosis of hypertension: Secondary | ICD-10-CM | POA: Diagnosis not present

## 2021-02-14 DIAGNOSIS — L659 Nonscarring hair loss, unspecified: Secondary | ICD-10-CM | POA: Diagnosis not present

## 2021-02-14 DIAGNOSIS — R350 Frequency of micturition: Secondary | ICD-10-CM | POA: Diagnosis not present

## 2021-02-15 DIAGNOSIS — M7989 Other specified soft tissue disorders: Secondary | ICD-10-CM | POA: Diagnosis not present

## 2021-03-20 DIAGNOSIS — S90112A Contusion of left great toe without damage to nail, initial encounter: Secondary | ICD-10-CM | POA: Diagnosis not present

## 2021-04-23 DIAGNOSIS — Z23 Encounter for immunization: Secondary | ICD-10-CM | POA: Diagnosis not present

## 2021-04-23 DIAGNOSIS — Z13220 Encounter for screening for lipoid disorders: Secondary | ICD-10-CM | POA: Diagnosis not present

## 2021-04-23 DIAGNOSIS — E559 Vitamin D deficiency, unspecified: Secondary | ICD-10-CM | POA: Diagnosis not present

## 2021-04-23 DIAGNOSIS — Z0000 Encounter for general adult medical examination without abnormal findings: Secondary | ICD-10-CM | POA: Diagnosis not present

## 2021-05-13 DIAGNOSIS — J019 Acute sinusitis, unspecified: Secondary | ICD-10-CM | POA: Diagnosis not present

## 2021-05-13 DIAGNOSIS — J209 Acute bronchitis, unspecified: Secondary | ICD-10-CM | POA: Diagnosis not present

## 2021-09-13 ENCOUNTER — Ambulatory Visit: Payer: Self-pay | Admitting: Sports Medicine

## 2021-09-15 DIAGNOSIS — M25571 Pain in right ankle and joints of right foot: Secondary | ICD-10-CM | POA: Diagnosis not present

## 2021-09-28 DIAGNOSIS — M76821 Posterior tibial tendinitis, right leg: Secondary | ICD-10-CM | POA: Diagnosis not present

## 2021-09-28 DIAGNOSIS — R29898 Other symptoms and signs involving the musculoskeletal system: Secondary | ICD-10-CM | POA: Diagnosis not present

## 2021-09-28 DIAGNOSIS — M2141 Flat foot [pes planus] (acquired), right foot: Secondary | ICD-10-CM | POA: Diagnosis not present

## 2021-10-31 DIAGNOSIS — Z1231 Encounter for screening mammogram for malignant neoplasm of breast: Secondary | ICD-10-CM | POA: Diagnosis not present

## 2021-11-06 DIAGNOSIS — Z1231 Encounter for screening mammogram for malignant neoplasm of breast: Secondary | ICD-10-CM | POA: Diagnosis not present

## 2021-11-06 DIAGNOSIS — N926 Irregular menstruation, unspecified: Secondary | ICD-10-CM | POA: Diagnosis not present

## 2021-11-06 DIAGNOSIS — Z01411 Encounter for gynecological examination (general) (routine) with abnormal findings: Secondary | ICD-10-CM | POA: Diagnosis not present

## 2021-11-06 DIAGNOSIS — R87610 Atypical squamous cells of undetermined significance on cytologic smear of cervix (ASC-US): Secondary | ICD-10-CM | POA: Diagnosis not present

## 2021-11-06 DIAGNOSIS — R635 Abnormal weight gain: Secondary | ICD-10-CM | POA: Diagnosis not present

## 2021-11-08 DIAGNOSIS — M9901 Segmental and somatic dysfunction of cervical region: Secondary | ICD-10-CM | POA: Diagnosis not present

## 2021-11-08 DIAGNOSIS — M5386 Other specified dorsopathies, lumbar region: Secondary | ICD-10-CM | POA: Diagnosis not present

## 2021-11-08 DIAGNOSIS — M5032 Other cervical disc degeneration, mid-cervical region, unspecified level: Secondary | ICD-10-CM | POA: Diagnosis not present

## 2021-11-08 DIAGNOSIS — M5414 Radiculopathy, thoracic region: Secondary | ICD-10-CM | POA: Diagnosis not present

## 2021-11-08 DIAGNOSIS — M9902 Segmental and somatic dysfunction of thoracic region: Secondary | ICD-10-CM | POA: Diagnosis not present

## 2021-11-08 DIAGNOSIS — M5137 Other intervertebral disc degeneration, lumbosacral region: Secondary | ICD-10-CM | POA: Diagnosis not present

## 2021-11-20 DIAGNOSIS — M76821 Posterior tibial tendinitis, right leg: Secondary | ICD-10-CM | POA: Diagnosis not present

## 2021-11-20 DIAGNOSIS — R29898 Other symptoms and signs involving the musculoskeletal system: Secondary | ICD-10-CM | POA: Diagnosis not present

## 2021-11-20 DIAGNOSIS — M2141 Flat foot [pes planus] (acquired), right foot: Secondary | ICD-10-CM | POA: Diagnosis not present

## 2021-11-22 DIAGNOSIS — M5386 Other specified dorsopathies, lumbar region: Secondary | ICD-10-CM | POA: Diagnosis not present

## 2021-11-22 DIAGNOSIS — M5032 Other cervical disc degeneration, mid-cervical region, unspecified level: Secondary | ICD-10-CM | POA: Diagnosis not present

## 2021-11-22 DIAGNOSIS — M9901 Segmental and somatic dysfunction of cervical region: Secondary | ICD-10-CM | POA: Diagnosis not present

## 2021-11-22 DIAGNOSIS — M9902 Segmental and somatic dysfunction of thoracic region: Secondary | ICD-10-CM | POA: Diagnosis not present

## 2021-11-22 DIAGNOSIS — M5137 Other intervertebral disc degeneration, lumbosacral region: Secondary | ICD-10-CM | POA: Diagnosis not present

## 2021-11-22 DIAGNOSIS — M5414 Radiculopathy, thoracic region: Secondary | ICD-10-CM | POA: Diagnosis not present

## 2021-12-15 DIAGNOSIS — R400 Somnolence: Secondary | ICD-10-CM | POA: Diagnosis not present

## 2021-12-15 DIAGNOSIS — R5383 Other fatigue: Secondary | ICD-10-CM | POA: Diagnosis not present

## 2021-12-15 DIAGNOSIS — Z6837 Body mass index (BMI) 37.0-37.9, adult: Secondary | ICD-10-CM | POA: Diagnosis not present

## 2021-12-15 DIAGNOSIS — E669 Obesity, unspecified: Secondary | ICD-10-CM | POA: Diagnosis not present

## 2021-12-20 DIAGNOSIS — I10 Essential (primary) hypertension: Secondary | ICD-10-CM | POA: Diagnosis not present

## 2021-12-20 DIAGNOSIS — R002 Palpitations: Secondary | ICD-10-CM | POA: Diagnosis not present

## 2022-01-16 DIAGNOSIS — E559 Vitamin D deficiency, unspecified: Secondary | ICD-10-CM | POA: Diagnosis not present

## 2022-01-16 DIAGNOSIS — I10 Essential (primary) hypertension: Secondary | ICD-10-CM | POA: Diagnosis not present

## 2022-01-16 DIAGNOSIS — Z6838 Body mass index (BMI) 38.0-38.9, adult: Secondary | ICD-10-CM | POA: Diagnosis not present

## 2022-01-17 DIAGNOSIS — I10 Essential (primary) hypertension: Secondary | ICD-10-CM | POA: Diagnosis not present

## 2022-02-05 DIAGNOSIS — Z3040 Encounter for surveillance of contraceptives, unspecified: Secondary | ICD-10-CM | POA: Diagnosis not present

## 2022-02-05 DIAGNOSIS — R030 Elevated blood-pressure reading, without diagnosis of hypertension: Secondary | ICD-10-CM | POA: Diagnosis not present

## 2022-02-05 DIAGNOSIS — R5383 Other fatigue: Secondary | ICD-10-CM | POA: Diagnosis not present

## 2022-02-05 DIAGNOSIS — L659 Nonscarring hair loss, unspecified: Secondary | ICD-10-CM | POA: Diagnosis not present

## 2022-02-24 ENCOUNTER — Ambulatory Visit (HOSPITAL_COMMUNITY)
Admission: EM | Admit: 2022-02-24 | Discharge: 2022-02-24 | Disposition: A | Discharge status: Home / Self Care | Payer: BC Managed Care – PPO | Attending: Physician Assistant | Admitting: Physician Assistant

## 2022-02-24 ENCOUNTER — Encounter (HOSPITAL_COMMUNITY): Payer: Self-pay

## 2022-02-24 DIAGNOSIS — S86911A Strain of unspecified muscle(s) and tendon(s) at lower leg level, right leg, initial encounter: Secondary | ICD-10-CM | POA: Diagnosis not present

## 2022-02-24 DIAGNOSIS — M25561 Pain in right knee: Secondary | ICD-10-CM | POA: Diagnosis not present

## 2022-02-24 DIAGNOSIS — L559 Sunburn, unspecified: Secondary | ICD-10-CM | POA: Diagnosis not present

## 2022-02-24 HISTORY — DX: Essential (primary) hypertension: I10

## 2022-02-24 MED ORDER — IBUPROFEN 600 MG PO TABS: 600.0000 mg | ORAL_TABLET | Freq: Three times a day (TID) | ORAL | 0 refills | Status: AC

## 2022-02-24 MED ORDER — TRIAMCINOLONE ACETONIDE 0.1 % EX CREA: 1.0000 | TOPICAL_CREAM | Freq: Two times a day (BID) | CUTANEOUS | 1 refills | Status: AC

## 2022-02-24 NOTE — ED Provider Notes (Signed)
[place]    CSN: [id number] Arrival date & time: 02/24/22  1621      History   Chief Complaint Chief Complaint  Patient presents with   Foot Pain   Knee Pain    HPI Katelyn Wright is a 46 y.o. female.   46 year old female presents with right knee pain.  Patient indicates that she was at the beach yesterday got out of the pickup truck when her skin had stuck to the left lower upholstery and twisted her right knee when her feet hit the pavement.  Patient indicates that she immediately felt a pop behind her right knee and started having pain and discomfort.  She indicates that she had difficulty trying to bear weight on the right leg due to the knee pain.  Patient indicates that she has been at home icing the area, and taking Advil 400 mg with minimal improvement.  Patient relates she continues to have pain of the right knee mainly anterior lateral and posterior, patient relates that she is unable to bear her full weight on her right leg due to the knee pain.  Patient relates that she has no trauma to the right knee, no numbness or tingling. Patient also indicates that she is allergic to the sun and while at the beach she did get some sunburn and she requests some cream that may help decrease the skin irritation and the rash.   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   Weight loss counseling, encounter for 01/20/2018   Chronic idiopathic constipation 01/20/2018   Dehydration, mild 01/20/2018   Vertigo/dizziness 01/20/2018   Environmental and seasonal allergies 12/11/2017   HSV infection 12/11/2017   Cold sore 12/11/2017   Abnormal menses 11/18/2017   Cough in adult 11/18/2017   Acute sinusitis 11/18/2017   Insomnia 02/14/2017   Vitamin D deficiency, with symptoms 01/10/2017   Eczema-   all over-  12/30/2016   Allergy- MSG, 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   h/o Kidney stones 09/18/2015   Obesity (BMI 30.0-34.9) 09/18/2015    Past Surgical History:  Procedure Laterality Date   ADENOIDECTOMY     CYSTOSCOPY WITH URETEROSCOPY AND STENT PLACEMENT Right 09/29/2015   Procedure: RIGHT  URETEROSCOPY AND STENT PLACEMENT, RETROGRADE;  Surgeon: Kathie Rhodes, MD;  Location: [facility];  Service: Urology;  Laterality: Right;   HOLMIUM LASER APPLICATION Right 10/28/8117   Procedure: WITH HOLMIUM LASER;  Surgeon: Kathie Rhodes, MD;  Location: [facility];  Service: Urology;  Laterality: Right;   right stent placed     Right Renal Stent   TONSILLECTOMY AND ADENOIDECTOMY  1994    OB History   No obstetric history on file.      Home Medications    Prior to Admission medications   Medication Sig Start Date End Date Taking? Authorizing Provider  ibuprofen (ADVIL) 600 MG tablet Take 1 tablet (600 mg total) by mouth 3 (three) times daily. 02/24/22  Yes Nyoka Lint, PA-C  triamcinolone cream (KENALOG) 0.1 % Apply 1 Application topically 2 (two) times daily. 02/24/22  Yes Nyoka Lint, PA-C  acetaminophen (TYLENOL) 500 MG tablet Take 1,000 mg by mouth every 6 (six) hours as needed for moderate pain.    [provider]  Cholecalciferol (  VITAMIN D3) 5000 units TABS 5,000 IU OTC vitamin D3 daily. 01/10/17   Mellody Dance, DO  Prenatal Vit-Fe Fumarate-FA (PRENATAL MULTIVITAMIN) TABS tablet Take 1 tablet by mouth daily at 12 noon.    [provider]  Vitamin D, Ergocalciferol, (DRISDOL) 1.25 MG (50000 UT) CAPS capsule Take 1 capsule (50,000 Units total) by mouth every 7 (seven) days. 07/19/19   Mellody Dance, DO    Family History Family History  Problem Relation Age of Onset   Arthritis Mother    Hypertension Mother    Diabetes Mother    Hypertension Father    Alcohol abuse Maternal Uncle    Heart disease Maternal Grandfather    Breast cancer  Paternal Grandmother     Social History Social History   Tobacco Use   Smoking status: Never   Smokeless tobacco: Never  Vaping Use   Vaping Use: Never used  Substance Use Topics   Alcohol use: No   Drug use: No     Allergies   Patient has no known allergies.   Review of Systems Review of Systems  Musculoskeletal:  Positive for joint swelling (right knee pain).     Physical Exam Triage Vital Signs ED Triage Vitals  Enc Vitals Group     BP 02/24/22 1708 127/78     Pulse Rate 02/24/22 1708 67     Resp 02/24/22 1708 12     Temp 02/24/22 1708 98.2 F (36.8 C)     Temp Source 02/24/22 1708 Oral     SpO2 02/24/22 1708 97 %     Weight 02/24/22 1712 213 lb (96.6 kg)     Height 02/24/22 1712 '5\' 3"'$  (1.6 m)     Head Circumference --      Peak Flow --      Pain Score 02/24/22 1711 4     Pain Loc --      Pain Edu? --      Excl. in [place]? --    No data found.  Updated Vital Signs BP 127/78 (BP Location: Left Arm)   Pulse 67   Temp 98.2 F (36.8 C) (Oral)   Resp 12   Ht '5\' 3"'$  (1.6 m)   Wt 213 lb (96.6 kg)   LMP 01/25/2022   SpO2 97%   BMI 37.73 kg/m   Visual Acuity Right Eye Distance:   Left Eye Distance:   Bilateral Distance:    Right Eye Near:   Left Eye Near:    Bilateral Near:     Physical Exam Constitutional:      Appearance: Normal appearance.  Musculoskeletal:       Legs:     Comments: Right knee: Pain is palpated along the lateral collateral ligament area of the joint line, tenderness on palpating shin of the patellar tendon inferiorly, tenderness is palpated at the lateral posterior knee.  Patient has pain when in full extension but is able to bend the knee to greater than 90 degrees without discomfort.  There is mild pain on varus stressing, drawer sign is negative.  No crepitus with motion  Neurological:     Mental Status: She is alert.      UC Treatments / Results  Labs (all labs ordered are listed, but only abnormal results are  displayed) Labs Reviewed - No data to display  EKG   Radiology No results found.  Procedures Procedures (including critical care time)  Medications Ordered in UC Medications - No data to display  Initial Impression /  Assessment and Plan / UC Course  I have reviewed the triage vital signs and the nursing notes.  Pertinent labs & imaging results that were available during my care of the patient were reviewed by me and considered in my medical decision making (see chart for details).    Plan: 1.  Patient is advised to use the crutches and wear the knee brace when up and try to ambulate around. 2.  Patient advised take ibuprofen 600 mg 1 every 8 hours with food to help decrease the pain and swelling. 3.  Patient advised to ice the area, 10 minutes on 20 minutes off, 3-4 times a day to help reduce the swelling and pain. 4.  Patient advised to consult [facility] since she has went there for prior orthopedic problems, to see if she can be seen if the knee fails to improve over the next several days. Final Clinical Impressions(s) / UC Diagnoses   Final diagnoses:  Acute pain of right knee  Knee strain, right, initial encounter  Sunburn     Discharge Instructions      Advised take ibuprofen 600 mg 1 every 8 hours with food to help decrease knee pain. Advised to use ice, 10 minutes on 20 minutes off, 3-4 times of the evening. Advised to use the brace and crutches when up and trying to move around to add additional support. Advise if not improved over the course of the next several days to call [facility] and see about being seen as a walk-in appointment and having the knee evaluated.    ED Prescriptions     Medication Sig Dispense Auth. Provider   ibuprofen (ADVIL) 600 MG tablet Take 1 tablet (600 mg total) by mouth 3 (three) times daily. 30 tablet Nyoka Lint, PA-C   triamcinolone cream (KENALOG) 0.1 % Apply 1 Application topically 2 (two) times daily. 30 g Nyoka Lint,  PA-C      PDMP not reviewed this encounter.   Nyoka Lint, PA-C 02/24/22 1752

## 2022-02-24 NOTE — ED Triage Notes (Signed)
Pt is here for injury to right knee x1day. Pt states right knee is swollen and painful to put pressure on knee  Pt denies falling

## 2022-02-24 NOTE — Discharge Instructions (Addendum)
Advised take ibuprofen 600 mg 1 every 8 hours with food to help decrease knee pain. Advised to use ice, 10 minutes on 20 minutes off, 3-4 times of the evening. Advised to use the brace and crutches when up and trying to move around to add additional support. Advise if not improved over the course of the next several days to call [facility] and see about being seen as a walk-in appointment and having the knee evaluated.

## 2022-03-01 DIAGNOSIS — N979 Female infertility, unspecified: Secondary | ICD-10-CM | POA: Diagnosis not present

## 2022-04-25 DIAGNOSIS — Z20822 Contact with and (suspected) exposure to covid-19: Secondary | ICD-10-CM | POA: Diagnosis not present

## 2022-04-25 DIAGNOSIS — B349 Viral infection, unspecified: Secondary | ICD-10-CM | POA: Diagnosis not present

## 2022-05-02 DIAGNOSIS — J019 Acute sinusitis, unspecified: Secondary | ICD-10-CM | POA: Diagnosis not present

## 2022-05-02 DIAGNOSIS — R52 Pain, unspecified: Secondary | ICD-10-CM | POA: Diagnosis not present

## 2022-05-02 DIAGNOSIS — R059 Cough, unspecified: Secondary | ICD-10-CM | POA: Diagnosis not present

## 2022-05-16 DIAGNOSIS — R109 Unspecified abdominal pain: Secondary | ICD-10-CM | POA: Diagnosis not present

## 2022-06-14 DIAGNOSIS — J019 Acute sinusitis, unspecified: Secondary | ICD-10-CM | POA: Diagnosis not present

## 2022-06-14 DIAGNOSIS — Z3202 Encounter for pregnancy test, result negative: Secondary | ICD-10-CM | POA: Diagnosis not present

## 2022-06-14 DIAGNOSIS — R1900 Intra-abdominal and pelvic swelling, mass and lump, unspecified site: Secondary | ICD-10-CM | POA: Diagnosis not present

## 2022-06-24 DIAGNOSIS — R195 Other fecal abnormalities: Secondary | ICD-10-CM | POA: Diagnosis not present

## 2022-06-24 DIAGNOSIS — K7689 Other specified diseases of liver: Secondary | ICD-10-CM | POA: Diagnosis not present

## 2022-06-24 DIAGNOSIS — N200 Calculus of kidney: Secondary | ICD-10-CM | POA: Diagnosis not present

## 2022-07-29 DIAGNOSIS — Z0000 Encounter for general adult medical examination without abnormal findings: Secondary | ICD-10-CM | POA: Diagnosis not present

## 2022-07-29 DIAGNOSIS — E559 Vitamin D deficiency, unspecified: Secondary | ICD-10-CM | POA: Diagnosis not present

## 2022-07-29 DIAGNOSIS — Z13220 Encounter for screening for lipoid disorders: Secondary | ICD-10-CM | POA: Diagnosis not present

## 2022-07-29 DIAGNOSIS — Z23 Encounter for immunization: Secondary | ICD-10-CM | POA: Diagnosis not present

## 2022-08-24 DIAGNOSIS — J019 Acute sinusitis, unspecified: Secondary | ICD-10-CM | POA: Diagnosis not present

## 2022-08-24 DIAGNOSIS — L209 Atopic dermatitis, unspecified: Secondary | ICD-10-CM | POA: Diagnosis not present

## 2022-08-24 DIAGNOSIS — H6691 Otitis media, unspecified, right ear: Secondary | ICD-10-CM | POA: Diagnosis not present

## 2022-09-16 DIAGNOSIS — J069 Acute upper respiratory infection, unspecified: Secondary | ICD-10-CM | POA: Diagnosis not present

## 2022-09-16 DIAGNOSIS — Z03818 Encounter for observation for suspected exposure to other biological agents ruled out: Secondary | ICD-10-CM | POA: Diagnosis not present

## 2022-10-06 DIAGNOSIS — H1011 Acute atopic conjunctivitis, right eye: Secondary | ICD-10-CM | POA: Diagnosis not present

## 2022-10-06 DIAGNOSIS — L309 Dermatitis, unspecified: Secondary | ICD-10-CM | POA: Diagnosis not present

## 2022-10-06 DIAGNOSIS — H6992 Unspecified Eustachian tube disorder, left ear: Secondary | ICD-10-CM | POA: Diagnosis not present

## 2022-10-27 ENCOUNTER — Other Ambulatory Visit (HOSPITAL_BASED_OUTPATIENT_CLINIC_OR_DEPARTMENT_OTHER): Payer: Self-pay

## 2022-10-27 MED ORDER — ZEPBOUND 12.5 MG/0.5ML ~~LOC~~ SOAJ
SUBCUTANEOUS | 0 refills | Status: DC
  Filled 2022-10-27: qty 2, 28d supply, fill #0

## 2022-10-28 ENCOUNTER — Other Ambulatory Visit (HOSPITAL_BASED_OUTPATIENT_CLINIC_OR_DEPARTMENT_OTHER): Payer: Self-pay

## 2022-10-29 DIAGNOSIS — K644 Residual hemorrhoidal skin tags: Secondary | ICD-10-CM | POA: Diagnosis not present

## 2022-11-17 ENCOUNTER — Other Ambulatory Visit (HOSPITAL_BASED_OUTPATIENT_CLINIC_OR_DEPARTMENT_OTHER): Payer: Self-pay

## 2022-11-17 MED ORDER — ZEPBOUND 12.5 MG/0.5ML ~~LOC~~ SOAJ
12.5000 mg | SUBCUTANEOUS | 0 refills | Status: DC
  Filled 2022-11-17 – 2022-11-26 (×5): qty 2, 28d supply, fill #0

## 2022-11-19 DIAGNOSIS — R87610 Atypical squamous cells of undetermined significance on cytologic smear of cervix (ASC-US): Secondary | ICD-10-CM | POA: Diagnosis not present

## 2022-11-19 DIAGNOSIS — N930 Postcoital and contact bleeding: Secondary | ICD-10-CM | POA: Diagnosis not present

## 2022-11-19 DIAGNOSIS — Z1239 Encounter for other screening for malignant neoplasm of breast: Secondary | ICD-10-CM | POA: Diagnosis not present

## 2022-11-19 DIAGNOSIS — Z01419 Encounter for gynecological examination (general) (routine) without abnormal findings: Secondary | ICD-10-CM | POA: Diagnosis not present

## 2022-11-19 DIAGNOSIS — Z113 Encounter for screening for infections with a predominantly sexual mode of transmission: Secondary | ICD-10-CM | POA: Diagnosis not present

## 2022-11-19 DIAGNOSIS — E559 Vitamin D deficiency, unspecified: Secondary | ICD-10-CM | POA: Diagnosis not present

## 2022-11-19 DIAGNOSIS — Z1231 Encounter for screening mammogram for malignant neoplasm of breast: Secondary | ICD-10-CM | POA: Diagnosis not present

## 2022-11-19 DIAGNOSIS — N926 Irregular menstruation, unspecified: Secondary | ICD-10-CM | POA: Diagnosis not present

## 2022-11-20 ENCOUNTER — Encounter: Payer: Self-pay | Admitting: Obstetrics and Gynecology

## 2022-11-20 ENCOUNTER — Other Ambulatory Visit: Payer: Self-pay | Admitting: Obstetrics and Gynecology

## 2022-11-20 DIAGNOSIS — N6310 Unspecified lump in the right breast, unspecified quadrant: Secondary | ICD-10-CM

## 2022-11-22 ENCOUNTER — Other Ambulatory Visit (HOSPITAL_BASED_OUTPATIENT_CLINIC_OR_DEPARTMENT_OTHER): Payer: Self-pay

## 2022-11-26 ENCOUNTER — Other Ambulatory Visit (HOSPITAL_BASED_OUTPATIENT_CLINIC_OR_DEPARTMENT_OTHER): Payer: Self-pay

## 2022-11-27 ENCOUNTER — Other Ambulatory Visit (HOSPITAL_BASED_OUTPATIENT_CLINIC_OR_DEPARTMENT_OTHER): Payer: Self-pay

## 2022-11-28 ENCOUNTER — Other Ambulatory Visit: Payer: Self-pay

## 2022-11-29 ENCOUNTER — Other Ambulatory Visit: Payer: Self-pay

## 2022-11-29 ENCOUNTER — Other Ambulatory Visit (HOSPITAL_BASED_OUTPATIENT_CLINIC_OR_DEPARTMENT_OTHER): Payer: Self-pay

## 2022-11-30 ENCOUNTER — Other Ambulatory Visit (HOSPITAL_BASED_OUTPATIENT_CLINIC_OR_DEPARTMENT_OTHER): Payer: Self-pay

## 2022-11-30 MED ORDER — ZEPBOUND 15 MG/0.5ML ~~LOC~~ SOAJ
15.0000 mg | SUBCUTANEOUS | 0 refills | Status: DC
  Filled 2022-11-30 – 2022-12-25 (×2): qty 2, 28d supply, fill #0

## 2022-12-02 ENCOUNTER — Other Ambulatory Visit (HOSPITAL_BASED_OUTPATIENT_CLINIC_OR_DEPARTMENT_OTHER): Payer: Self-pay

## 2022-12-03 ENCOUNTER — Other Ambulatory Visit (HOSPITAL_BASED_OUTPATIENT_CLINIC_OR_DEPARTMENT_OTHER): Payer: Self-pay

## 2022-12-03 ENCOUNTER — Other Ambulatory Visit (HOSPITAL_COMMUNITY): Payer: Self-pay

## 2022-12-05 ENCOUNTER — Ambulatory Visit
Admission: RE | Admit: 2022-12-05 | Discharge: 2022-12-05 | Disposition: A | Discharge status: Home / Self Care | Payer: BC Managed Care – PPO | Source: Ambulatory Visit | Attending: Obstetrics and Gynecology | Admitting: Obstetrics and Gynecology

## 2022-12-05 DIAGNOSIS — N6310 Unspecified lump in the right breast, unspecified quadrant: Secondary | ICD-10-CM

## 2022-12-05 DIAGNOSIS — N939 Abnormal uterine and vaginal bleeding, unspecified: Secondary | ICD-10-CM | POA: Diagnosis not present

## 2022-12-05 DIAGNOSIS — R922 Inconclusive mammogram: Secondary | ICD-10-CM | POA: Diagnosis not present

## 2022-12-05 DIAGNOSIS — N926 Irregular menstruation, unspecified: Secondary | ICD-10-CM | POA: Diagnosis not present

## 2022-12-25 ENCOUNTER — Other Ambulatory Visit (HOSPITAL_BASED_OUTPATIENT_CLINIC_OR_DEPARTMENT_OTHER): Payer: Self-pay

## 2022-12-25 MED ORDER — ZEPBOUND 15 MG/0.5ML ~~LOC~~ SOAJ
15.0000 mg | SUBCUTANEOUS | 0 refills | Status: AC
  Filled 2022-12-25 – 2023-01-24 (×2): qty 2, 28d supply, fill #0

## 2023-01-24 ENCOUNTER — Other Ambulatory Visit (HOSPITAL_BASED_OUTPATIENT_CLINIC_OR_DEPARTMENT_OTHER): Payer: Self-pay

## 2023-01-26 ENCOUNTER — Other Ambulatory Visit (HOSPITAL_BASED_OUTPATIENT_CLINIC_OR_DEPARTMENT_OTHER): Payer: Self-pay

## 2023-01-26 MED ORDER — ZEPBOUND 15 MG/0.5ML ~~LOC~~ SOAJ
15.0000 mg | SUBCUTANEOUS | 0 refills | Status: AC
  Filled 2023-01-26: qty 2, 28d supply, fill #0

## 2023-02-01 ENCOUNTER — Other Ambulatory Visit (HOSPITAL_BASED_OUTPATIENT_CLINIC_OR_DEPARTMENT_OTHER): Payer: Self-pay

## 2023-02-03 DIAGNOSIS — J069 Acute upper respiratory infection, unspecified: Secondary | ICD-10-CM | POA: Diagnosis not present

## 2023-02-03 DIAGNOSIS — L308 Other specified dermatitis: Secondary | ICD-10-CM | POA: Diagnosis not present

## 2023-02-03 DIAGNOSIS — B353 Tinea pedis: Secondary | ICD-10-CM | POA: Diagnosis not present

## 2023-02-07 DIAGNOSIS — R5383 Other fatigue: Secondary | ICD-10-CM | POA: Diagnosis not present

## 2023-02-07 DIAGNOSIS — R051 Acute cough: Secondary | ICD-10-CM | POA: Diagnosis not present

## 2023-03-03 DIAGNOSIS — I10 Essential (primary) hypertension: Secondary | ICD-10-CM | POA: Diagnosis not present

## 2023-03-26 ENCOUNTER — Other Ambulatory Visit (HOSPITAL_BASED_OUTPATIENT_CLINIC_OR_DEPARTMENT_OTHER): Payer: Self-pay

## 2023-05-31 DIAGNOSIS — J069 Acute upper respiratory infection, unspecified: Secondary | ICD-10-CM | POA: Diagnosis not present

## 2023-05-31 DIAGNOSIS — J029 Acute pharyngitis, unspecified: Secondary | ICD-10-CM | POA: Diagnosis not present

## 2023-05-31 DIAGNOSIS — R051 Acute cough: Secondary | ICD-10-CM | POA: Diagnosis not present

## 2023-05-31 DIAGNOSIS — R509 Fever, unspecified: Secondary | ICD-10-CM | POA: Diagnosis not present

## 2023-06-03 DIAGNOSIS — J208 Acute bronchitis due to other specified organisms: Secondary | ICD-10-CM | POA: Diagnosis not present

## 2023-07-28 ENCOUNTER — Other Ambulatory Visit (HOSPITAL_COMMUNITY): Payer: Self-pay

## 2023-08-18 DIAGNOSIS — E663 Overweight: Secondary | ICD-10-CM | POA: Diagnosis not present

## 2023-08-18 DIAGNOSIS — I10 Essential (primary) hypertension: Secondary | ICD-10-CM | POA: Diagnosis not present

## 2023-08-18 DIAGNOSIS — Z13220 Encounter for screening for lipoid disorders: Secondary | ICD-10-CM | POA: Diagnosis not present

## 2023-08-18 DIAGNOSIS — Z0000 Encounter for general adult medical examination without abnormal findings: Secondary | ICD-10-CM | POA: Diagnosis not present

## 2023-08-18 DIAGNOSIS — Z23 Encounter for immunization: Secondary | ICD-10-CM | POA: Diagnosis not present

## 2023-10-10 DIAGNOSIS — L2084 Intrinsic (allergic) eczema: Secondary | ICD-10-CM | POA: Diagnosis not present

## 2023-10-10 DIAGNOSIS — L309 Dermatitis, unspecified: Secondary | ICD-10-CM | POA: Diagnosis not present

## 2023-11-17 ENCOUNTER — Other Ambulatory Visit: Payer: Self-pay

## 2023-11-17 ENCOUNTER — Emergency Department (HOSPITAL_COMMUNITY)
Admission: EM | Admit: 2023-11-17 | Discharge: 2023-11-17 | Disposition: A | Discharge status: Home / Self Care | Attending: Emergency Medicine | Admitting: Emergency Medicine

## 2023-11-17 ENCOUNTER — Encounter (HOSPITAL_COMMUNITY): Payer: Self-pay | Admitting: Emergency Medicine

## 2023-11-17 ENCOUNTER — Emergency Department (HOSPITAL_COMMUNITY)

## 2023-11-17 DIAGNOSIS — E876 Hypokalemia: Secondary | ICD-10-CM | POA: Insufficient documentation

## 2023-11-17 DIAGNOSIS — R42 Dizziness and giddiness: Secondary | ICD-10-CM | POA: Diagnosis not present

## 2023-11-17 DIAGNOSIS — E041 Nontoxic single thyroid nodule: Secondary | ICD-10-CM | POA: Diagnosis not present

## 2023-11-17 LAB — BASIC METABOLIC PANEL WITH GFR
Anion gap: 10 (ref 5–15)
BUN: 12 mg/dL (ref 6–20)
CO2: 29 mmol/L (ref 22–32)
Calcium: 9.4 mg/dL (ref 8.9–10.3)
Chloride: 102 mmol/L (ref 98–111)
Creatinine, Ser: 0.86 mg/dL (ref 0.44–1.00)
GFR, Estimated: >60 mL/min (ref >60)
Glucose, Bld: 104 mg/dL — ABNORMAL HIGH (ref 70–99)
Potassium: 3.3 mmol/L — ABNORMAL LOW (ref 3.5–5.1)
Sodium: 141 mmol/L (ref 135–145)

## 2023-11-17 LAB — URINALYSIS, ROUTINE W REFLEX MICROSCOPIC
Bilirubin Urine: NEGATIVE
Glucose, UA: NEGATIVE mg/dL
Hgb urine dipstick: NEGATIVE
Ketones, ur: NEGATIVE mg/dL
Leukocytes,Ua: NEGATIVE
Nitrite: NEGATIVE
Protein, ur: NEGATIVE mg/dL
Specific Gravity, Urine: 1.001 — ABNORMAL LOW (ref 1.005–1.030)
pH: 8 (ref 5.0–8.0)

## 2023-11-17 LAB — HCG, SERUM, QUALITATIVE: Preg, Serum: NEGATIVE

## 2023-11-17 LAB — CBC
HCT: 41.1 % (ref 36.0–46.0)
Hemoglobin: 13.9 g/dL (ref 12.0–15.0)
MCH: 29.4 pg (ref 26.0–34.0)
MCHC: 33.8 g/dL (ref 30.0–36.0)
MCV: 87.1 fL (ref 80.0–100.0)
Platelets: 196 10*3/uL (ref 150–400)
RBC: 4.72 MIL/uL (ref 3.87–5.11)
RDW: 13.2 % (ref 11.5–15.5)
WBC: 6.7 10*3/uL (ref 4.0–10.5)
nRBC: 0 % (ref 0.0–0.2)

## 2023-11-17 LAB — CBG MONITORING, ED: Glucose-Capillary: 81 mg/dL (ref 70–99)

## 2023-11-17 MED ORDER — MECLIZINE HCL 25 MG PO TABS
25.0000 mg | ORAL_TABLET | Freq: Once | ORAL | Status: AC
  Administered 2023-11-17: 25 mg via ORAL
  Filled 2023-11-17: qty 1

## 2023-11-17 MED ORDER — IOHEXOL 350 MG/ML SOLN
75.0000 mL | Freq: Once | INTRAVENOUS | Status: AC | PRN
  Administered 2023-11-17: 75 mL via INTRAVENOUS

## 2023-11-17 MED ORDER — MECLIZINE HCL 25 MG PO TABS: 25.0000 mg | ORAL_TABLET | Freq: Three times a day (TID) | ORAL | 0 refills | Status: AC | PRN

## 2023-11-17 NOTE — ED Notes (Signed)
 Pt ambulatory to restroom

## 2023-11-17 NOTE — ED Provider Notes (Signed)
 [place] EMERGENCY DEPARTMENT AT [facility] Provider Note   CSN: [id number] Arrival date & time: 11/17/23  1722     History Chief Complaint  Patient presents with   Dizziness    Katelyn Wright is a 48 y.o. female.  Patient with past history significant for insomnia, obesity, vertigo, dizziness presents to the emergency department today with concerns of dizziness.  She reports that around 3 PM this afternoon, she noted a drastic increase in dizziness.  She reported the dizziness as a "shaking, vibration" type feeling.  No history of similar symptoms.  Specifically worsened with positional changes of her head.  Denies any room spinning sensation.  Only recent medication change as far she notes is that she was changed from Zepbound  to [facility] in the last month.  Recently had a dose increase of the [facility] about 2 weeks back.  She does note some mild onset of symptoms around the time that she had an increased dose of Wegovy.  Denies any nausea, vomiting, diarrhea, or abdominal pain. Does also endorse a fullness feeling in bilateral ears but denies any abnormal sounds or noises.   Dizziness      Home Medications Prior to Admission medications   Medication Sig Start Date End Date Taking? Authorizing Provider  meclizine (ANTIVERT) 25 MG tablet Take 1 tablet (25 mg total) by mouth 3 (three) times daily as needed for dizziness. 11/17/23  Yes Kyrstan Gotwalt A, PA-C  acetaminophen  (TYLENOL ) 500 MG tablet Take 1,000 mg by mouth every 6 (six) hours as needed for moderate pain.    [provider]  Cholecalciferol (VITAMIN D3) 5000 units TABS 5,000 IU OTC vitamin D3 daily. 01/10/17   Opalski, Bernardo Bridgeman, DO  ibuprofen  (ADVIL ) 600 MG tablet Take 1 tablet (600 mg total) by mouth 3 (three) times daily. 02/24/22   Gretel Leaven, PA-C  Prenatal Vit-Fe Fumarate-FA (PRENATAL MULTIVITAMIN) TABS tablet Take 1 tablet by mouth daily at 12 noon.    [provider]  triamcinolone  cream (KENALOG )  0.1 % Apply 1 Application topically 2 (two) times daily. 02/24/22   Gretel Leaven, PA-C  Vitamin D , Ergocalciferol , (DRISDOL ) 1.25 MG (50000 UT) CAPS capsule Take 1 capsule (50,000 Units total) by mouth every 7 (seven) days. 07/19/19   Opalski, Deborah, DO  ZEPBOUND  15 MG/0.5ML Pen Inject 15 mg into the skin once a week. 01/26/23         Allergies    Patient has no known allergies.    Review of Systems   Review of Systems  Neurological:  Positive for dizziness.  All other systems reviewed and are negative.   Physical Exam Updated Vital Signs BP (!) 147/89   Pulse 63   Temp 98.7 F (37.1 C) (Oral)   Resp 14   Ht 5\' 3"  (1.6 m)   Wt 96.6 kg   SpO2 100%   BMI 37.72 kg/m  Physical Exam Vitals and nursing note reviewed.  Constitutional:      General: She is not in acute distress.    Appearance: She is well-developed.  HENT:     Head: Normocephalic and atraumatic.     Right Ear: Tympanic membrane, ear canal and external ear normal. There is no impacted cerumen.     Left Ear: Tympanic membrane, ear canal and external ear normal. There is no impacted cerumen.  Eyes:     Conjunctiva/sclera: Conjunctivae normal.  Cardiovascular:     Rate and Rhythm: Normal rate and regular rhythm.     Heart sounds:  No murmur heard. Pulmonary:     Effort: Pulmonary effort is normal. No respiratory distress.     Breath sounds: Normal breath sounds.  Abdominal:     Palpations: Abdomen is soft.     Tenderness: There is no abdominal tenderness.  Musculoskeletal:        General: No swelling.     Cervical back: Neck supple.  Skin:    General: Skin is warm and dry.     Capillary Refill: Capillary refill takes less than 2 seconds.  Neurological:     General: No focal deficit present.     Mental Status: She is alert.     Comments: HINTS exam reveals no obvious concerns for central source. Head impulse test with corrective saccade, no appreciable nystagmus in any direction, and test of skew shows no  deviation.  Psychiatric:        Mood and Affect: Mood normal.     ED Results / Procedures / Treatments   Labs (all labs ordered are listed, but only abnormal results are displayed) Labs Reviewed  BASIC METABOLIC PANEL WITH GFR - Abnormal; Notable for the following components:      Result Value   Potassium 3.3 (*)    Glucose, Bld 104 (*)    All other components within normal limits  URINALYSIS, ROUTINE W REFLEX MICROSCOPIC - Abnormal; Notable for the following components:   Color, Urine COLORLESS (*)    Specific Gravity, Urine 1.001 (*)    All other components within normal limits  CBC  HCG, SERUM, QUALITATIVE  CBG MONITORING, ED    EKG None  Radiology CT ANGIO HEAD NECK W WO CM Result Date: 11/17/2023 CLINICAL DATA:  Vertigo, central EXAM: CT ANGIOGRAPHY HEAD AND NECK WITH AND WITHOUT CONTRAST TECHNIQUE: Multidetector CT imaging of the head and neck was performed using the standard protocol during bolus administration of intravenous contrast. Multiplanar CT image reconstructions and MIPs were obtained to evaluate the vascular anatomy. Carotid stenosis measurements (when applicable) are obtained utilizing NASCET criteria, using the distal internal carotid diameter as the denominator. RADIATION DOSE REDUCTION: This exam was performed according to the departmental dose-optimization program which includes automated exposure control, adjustment of the mA and/or kV according to patient size and/or use of iterative reconstruction technique. CONTRAST:  75mL OMNIPAQUE  IOHEXOL  350 MG/ML SOLN COMPARISON:  None Available. FINDINGS: CT HEAD FINDINGS Brain: There is no evidence of an acute infarct, intracranial hemorrhage, mass, midline shift, or extra-axial fluid collection. Cerebral volume is normal. The ventricles are normal in size. Vascular: No hyperdense vessel. Skull: No fracture or suspicious lesion. Sinuses/Orbits: Paranasal sinuses and mastoid air cells are clear. Unremarkable orbits. Other:  None. Review of the MIP images confirms the above findings CTA NECK FINDINGS Aortic arch: Standard branching.  Widely patent arch vessel origins. Right carotid system: Patent without evidence of stenosis, dissection, or significant atherosclerosis. Left carotid system: Patent without evidence of stenosis, dissection, or significant atherosclerosis. Vertebral arteries: Patent without evidence of stenosis, dissection, or significant atherosclerosis. Strongly dominant right vertebral artery with diffuse hypoplasia of the left vertebral artery. Skeleton: Mild cervical spondylosis. Other neck: 1.9 cm hypodense nodule in the right thyroid  lobe. No evidence of cervical lymphadenopathy. Upper chest: Clear lung apices. Review of the MIP images confirms the above findings CTA HEAD FINDINGS Anterior circulation: The internal carotid arteries are widely patent from skull base to carotid termini. ACAs and MCAs are patent without evidence of a proximal branch occlusion or significant proximal stenosis. No aneurysm is identified. Posterior  circulation: The intracranial vertebral arteries are patent to the basilar. Patent PICA, AICA, and SCA origins are visualized bilaterally. The basilar artery is widely patent. There are small left and diminutive or absent right posterior communicating arteries. Both PCAs are patent without evidence of a significant proximal stenosis. No aneurysm is identified. Venous sinuses: As permitted by contrast timing, patent. Anatomic variants: Strongly dominant right vertebral artery. Review of the MIP images confirms the above findings IMPRESSION: 1. No evidence of acute intracranial abnormality. 2. No large vessel occlusion, significant stenosis, or aneurysm in the head or neck. 3. 1.9 cm right thyroid  nodule. Recommend non-emergent thyroid  ultrasound. Reference: J Am Coll Radiol. 2015 Feb;12(2): 143-50 Electronically Signed   By: Aundra Lee M.D.   On: 11/17/2023 21:22    Procedures Procedures     Medications Ordered in ED Medications  meclizine (ANTIVERT) tablet 25 mg (has no administration in time range)  iohexol  (OMNIPAQUE ) 350 MG/ML injection 75 mL (75 mLs Intravenous Contrast Given 11/17/23 2101)    ED Course/ Medical Decision Making/ A&P                                 Medical Decision Making Amount and/or Complexity of Data Reviewed Labs: ordered. Radiology: ordered.  Risk Prescription drug management.   This patient presents to the ED for concern of dizziness. Differential diagnosis includes BPPV, meniere's disease, stroke, medication side effect   Lab Tests:  I Ordered, and personally interpreted labs.  The pertinent results include:  CBC unremarkable, BMP with mild hypokalemia and otherwise unremarkable, UA with some signs of overhydration with colorless urine and decreased specific gravity, hcg negative   Imaging Studies ordered:  I ordered imaging studies including CT angio head and neck  I independently visualized and interpreted imaging which showed no abnormal findings I agree with the radiologist interpretation   Medicines ordered and prescription drug management:  I ordered medication including meclizine for dizziness Reevaluation of the patient after these medicines showed that the patient improved I have reviewed the patients home medicines and have made adjustments as needed   Problem List / ED Course:  Patient presents to the emergency department today with concerns of dizziness.  She has a past history of insomnia, obesity, vertigo, dizziness.  Reports that she had an acute onset develop around 3 PM with feelings of head pressure and "boat rocking".  She states that the symptoms typically worsen with positional changes of her head.  Also nursing some fullness in her ears.  She states that she has had some mild symptoms last few days that she was attributing to possibly increasing dose of Wegovy or possible food intake. Physical exam is  largely reassuring.  Pupils are equal and reactive.  Hints exam shows no obvious concern for a central source and leans towards a peripheral source although no nystagmus was appreciated.  Given patient's concern, obtain labs and CT angio head and neck for further assessment.  No acute findings to suggest stroke. Lab workup is largely reassuring.  Mild hypokalemia 3.3.  Otherwise unremarkable.  Patient appears to likely be somewhat over hydrated as her urine is colorless and has a low specific gravity.  No other abnormal findings seen. Informed patient and given reassuring workup, course of meclizine could be helpful for her.  Doubtful of central source at this time and anticipate this is likely peripheral cause.  Mnire's disease in the differential given patient's reported worsening  of symptoms after heavy salt intake.  Advised in the meantime, course of meclizine as well as reduce sodium intake as attempted treatment for her symptoms.  Advised benefit of outpatient follow-up.  Patient understandable to this plan and agreeable.  No other acute concerns at this time.  Patient verbalized understanding all return precautions.  Otherwise stable at this time for outpatient follow-up and discharged home.   Final Clinical Impression(s) / ED Diagnoses Final diagnoses:  Dizziness  Hypokalemia    Rx / DC Orders ED Discharge Orders          Ordered    meclizine (ANTIVERT) 25 MG tablet  3 times daily PRN        11/17/23 2238              Alijah Hyde A, PA-C 11/17/23 2244    Hershel Los, MD 11/17/23 2315

## 2023-11-17 NOTE — Discharge Instructions (Signed)
 You were seen in the emergency department today for concerns of dizziness.  Your labs, and imaging were thankfully reassuring today without any abnormal findings seen.  I am unsure exactly what is causing her dizziness but at this time does not appear to be anything related to any of the vessels in your head or neck or anything in your brain such as a mass.  I would strongly recommend continuing to evaluate this dizziness if it continues on by either seeking evaluation from a neurologist or an ENT.  I have prescribed you meclizine in an effort to try to help some of his dizziness.  Take this medication to 3 times a day for dizziness.  If you have any concerns for any new or worsening symptoms, please return to the emergency department for further evaluation.

## 2023-11-17 NOTE — ED Triage Notes (Signed)
 Started to feel dizzy around 3pm and went home denies weakness. Pt states BP was elevated at home. Pt lost 40lbs and was taken off prescription bp meds in November. No other symptoms and no pain.

## 2023-11-25 DIAGNOSIS — R87610 Atypical squamous cells of undetermined significance on cytologic smear of cervix (ASC-US): Secondary | ICD-10-CM | POA: Diagnosis not present

## 2023-11-25 DIAGNOSIS — Z01419 Encounter for gynecological examination (general) (routine) without abnormal findings: Secondary | ICD-10-CM | POA: Diagnosis not present

## 2023-11-25 DIAGNOSIS — Z1231 Encounter for screening mammogram for malignant neoplasm of breast: Secondary | ICD-10-CM | POA: Diagnosis not present

## 2024-07-19 DIAGNOSIS — Z23 Encounter for immunization: Secondary | ICD-10-CM | POA: Diagnosis not present
